# Patient Record
Sex: Male | Born: 1970 | Race: Black or African American | Hispanic: No | Marital: Single | Smoking: Never smoker
Health system: Southern US, Community
[De-identification: ages and names within clinical notes are randomized; demographics above are authoritative.]

## PROBLEM LIST (undated history)

## (undated) DIAGNOSIS — I10 Essential (primary) hypertension: Secondary | ICD-10-CM

## (undated) DIAGNOSIS — E119 Type 2 diabetes mellitus without complications: Secondary | ICD-10-CM

## (undated) HISTORY — PX: OTHER SURGICAL HISTORY: SHX169

---

## 2005-02-18 ENCOUNTER — Emergency Department: Payer: Self-pay | Admitting: Emergency Medicine

## 2006-07-04 IMAGING — CR DG ABDOMEN 3V
1 series · 5 of 5 positions shown · non-contrast
Comparison: none

REASON FOR EXAM: abd pain
COMMENTS:

[Series 1: view not recorded · 0.17mm/px · 5 of 5 slices shown]
[im 1/5]
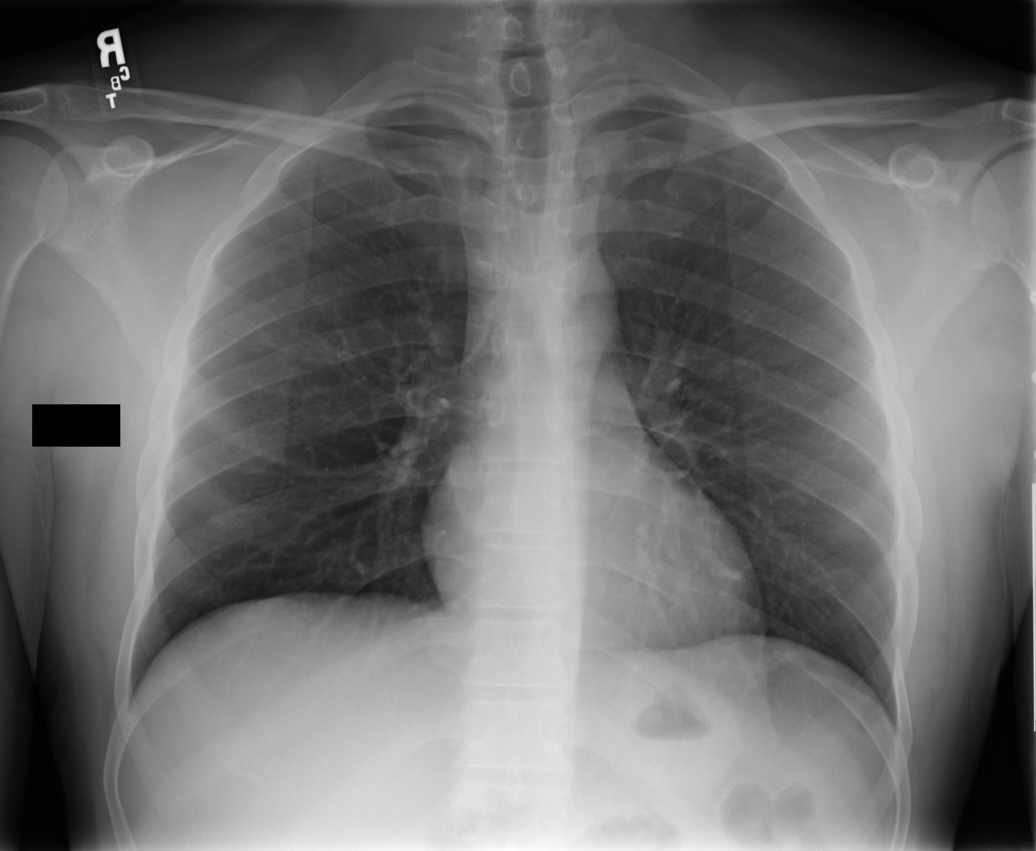
[im 2/5]
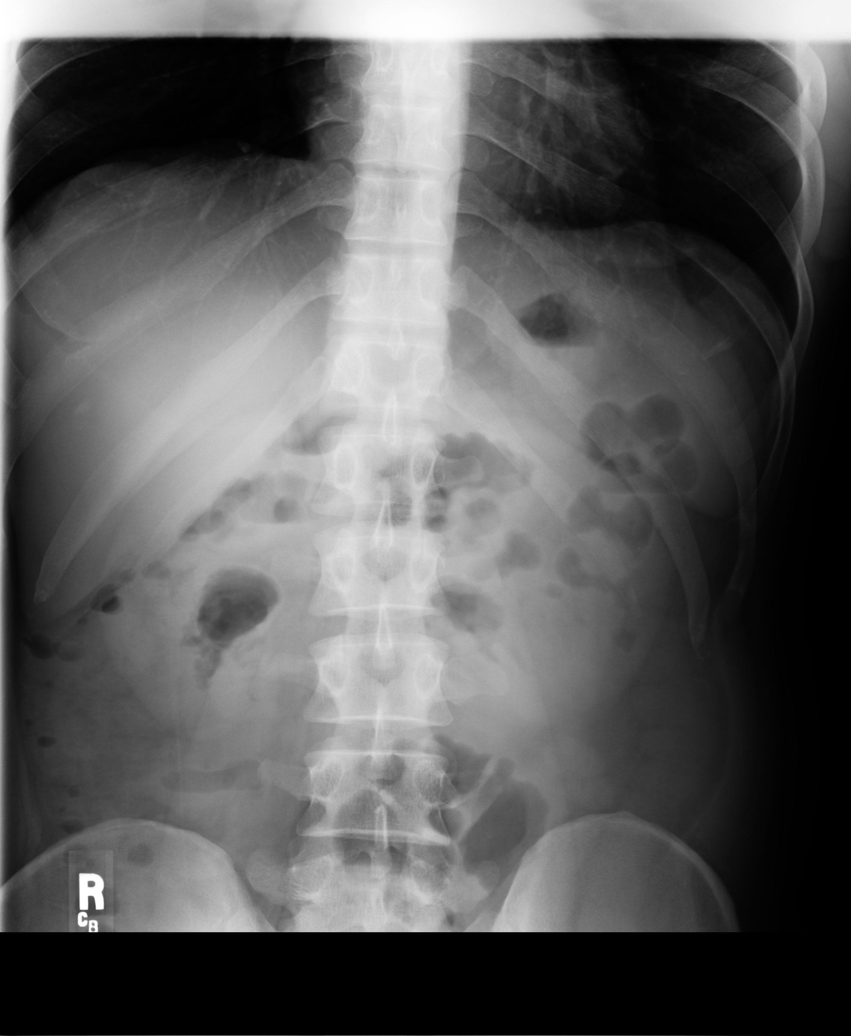
[im 3/5]
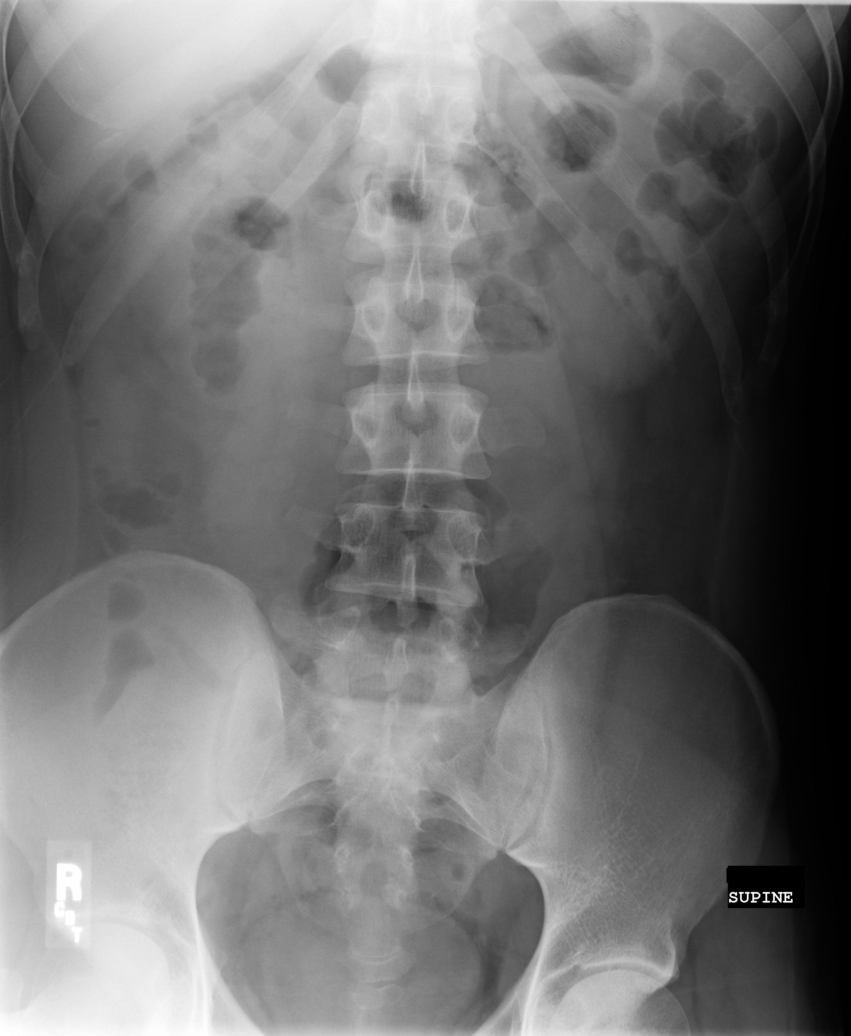
[im 4/5]
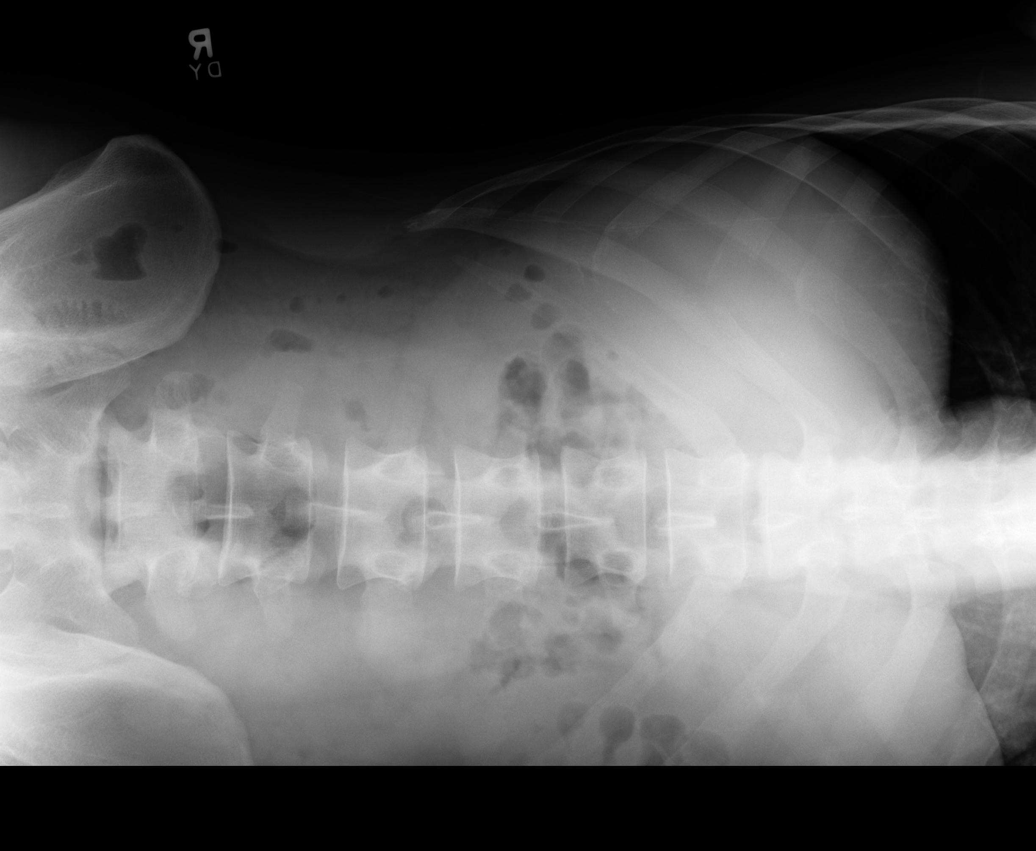
[im 5/5]
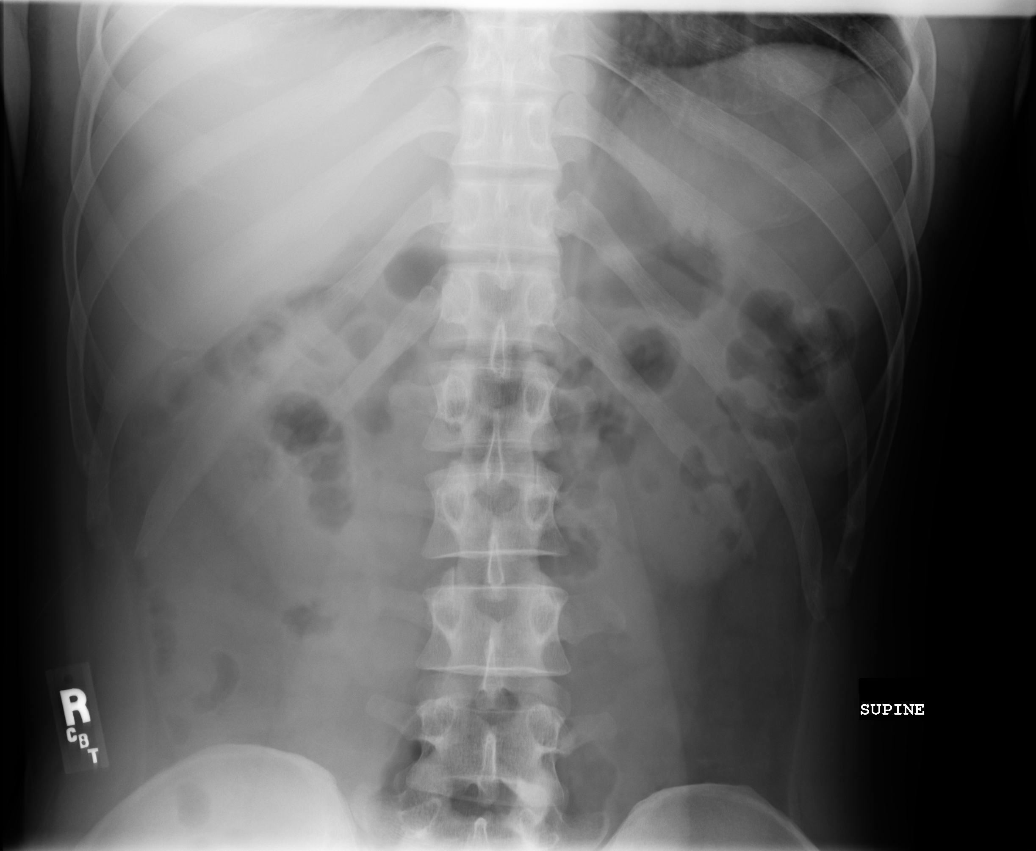

[5 of 5 positions shown; findings below may reference images not displayed]

PROCEDURE:     DXR - DXR ABDOMEN 3-WAY (INCL PA CXR)  - February 18, 2005  [DATE]

RESULT:     A single view of the chest shows the lungs are clear. The heart
and pulmonary vessels appear to be normal. The bony and mediastinal
structures are unremarkable.

The bowel gas pattern is unremarkable.  No free air or air fluid levels are
seen. Air and stool are scattered through the colon down to the rectum. No
abnormal calcifications are seen.
IMPRESSION: 2)No acute cardiopulmonary disease.

## 2009-01-11 ENCOUNTER — Emergency Department (HOSPITAL_COMMUNITY): Admission: EM | Admit: 2009-01-11 | Discharge: 2009-01-11 | Payer: Self-pay | Admitting: Emergency Medicine

## 2011-01-13 ENCOUNTER — Emergency Department: Payer: Self-pay | Admitting: Internal Medicine

## 2014-05-10 ENCOUNTER — Encounter (HOSPITAL_COMMUNITY): Payer: Self-pay | Admitting: Emergency Medicine

## 2014-05-10 ENCOUNTER — Emergency Department (HOSPITAL_COMMUNITY)
Admission: EM | Admit: 2014-05-10 | Discharge: 2014-05-10 | Disposition: A | Discharge status: Home / Self Care | Payer: Worker's Compensation | Attending: Emergency Medicine | Admitting: Emergency Medicine

## 2014-05-10 DIAGNOSIS — IMO0002 Reserved for concepts with insufficient information to code with codable children: Secondary | ICD-10-CM | POA: Insufficient documentation

## 2014-05-10 DIAGNOSIS — Y9289 Other specified places as the place of occurrence of the external cause: Secondary | ICD-10-CM | POA: Insufficient documentation

## 2014-05-10 DIAGNOSIS — Y9389 Activity, other specified: Secondary | ICD-10-CM | POA: Insufficient documentation

## 2014-05-10 DIAGNOSIS — Y990 Civilian activity done for income or pay: Secondary | ICD-10-CM | POA: Diagnosis not present

## 2014-05-10 DIAGNOSIS — X503XXA Overexertion from repetitive movements, initial encounter: Secondary | ICD-10-CM | POA: Insufficient documentation

## 2014-05-10 DIAGNOSIS — S46911A Strain of unspecified muscle, fascia and tendon at shoulder and upper arm level, right arm, initial encounter: Secondary | ICD-10-CM

## 2014-05-10 DIAGNOSIS — S4980XA Other specified injuries of shoulder and upper arm, unspecified arm, initial encounter: Secondary | ICD-10-CM | POA: Insufficient documentation

## 2014-05-10 DIAGNOSIS — S46909A Unspecified injury of unspecified muscle, fascia and tendon at shoulder and upper arm level, unspecified arm, initial encounter: Secondary | ICD-10-CM | POA: Insufficient documentation

## 2014-05-10 MED ORDER — HYDROCODONE-ACETAMINOPHEN 5-325 MG PO TABS: 1.0000 | ORAL_TABLET | ORAL | Status: DC | PRN

## 2014-05-10 MED ORDER — METHOCARBAMOL 500 MG PO TABS: 1000.0000 mg | ORAL_TABLET | Freq: Three times a day (TID) | ORAL | Status: DC

## 2014-05-10 MED ORDER — MELOXICAM 15 MG PO TABS: 15.0000 mg | ORAL_TABLET | Freq: Every day | ORAL | Status: DC

## 2014-05-10 MED ORDER — ACETAMINOPHEN 500 MG PO TABS
1000.0000 mg | ORAL_TABLET | Freq: Once | ORAL | Status: AC
  Administered 2014-05-10: 1000 mg via ORAL
  Filled 2014-05-10: qty 2

## 2014-05-10 MED ORDER — IBUPROFEN 800 MG PO TABS
800.0000 mg | ORAL_TABLET | Freq: Once | ORAL | Status: AC
  Administered 2014-05-10: 800 mg via ORAL
  Filled 2014-05-10: qty 1

## 2014-05-10 NOTE — Discharge Instructions (Signed)
Please use the shoulder immobilizer for the next 4 or 5 days. Please use Robaxin and Mobic daily. Use Norco for severe pain if needed. Robaxin and Norco may cause drowsiness, please use with caution. Please see Dr. Hilda Lias for additional evaluation if not improving.
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
## 2014-05-10 NOTE — ED Notes (Signed)
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

## 2014-05-10 NOTE — ED Provider Notes (Signed)
Medical screening examination/treatment/procedure(s) were performed by non-physician practitioner and as supervising physician I was immediately available for consultation/collaboration.   EKG Interpretation None        Keren Alverio L Judea Riches, MD 05/10/14 1456 

## 2017-10-26 ENCOUNTER — Other Ambulatory Visit: Payer: Self-pay

## 2017-10-26 ENCOUNTER — Encounter (HOSPITAL_COMMUNITY): Payer: Self-pay

## 2017-10-26 ENCOUNTER — Emergency Department (HOSPITAL_COMMUNITY): Payer: Self-pay

## 2017-10-26 ENCOUNTER — Emergency Department (HOSPITAL_COMMUNITY)
Admission: EM | Admit: 2017-10-26 | Discharge: 2017-10-26 | Disposition: A | Discharge status: Home / Self Care | Payer: Self-pay | Attending: Emergency Medicine | Admitting: Emergency Medicine

## 2017-10-26 DIAGNOSIS — Y999 Unspecified external cause status: Secondary | ICD-10-CM | POA: Insufficient documentation

## 2017-10-26 DIAGNOSIS — Y929 Unspecified place or not applicable: Secondary | ICD-10-CM | POA: Insufficient documentation

## 2017-10-26 DIAGNOSIS — S4351XA Sprain of right acromioclavicular joint, initial encounter: Secondary | ICD-10-CM | POA: Insufficient documentation

## 2017-10-26 DIAGNOSIS — W1839XA Other fall on same level, initial encounter: Secondary | ICD-10-CM | POA: Insufficient documentation

## 2017-10-26 DIAGNOSIS — Y939 Activity, unspecified: Secondary | ICD-10-CM | POA: Insufficient documentation

## 2017-10-26 MED ORDER — NAPROXEN 500 MG PO TABS: 500.0000 mg | ORAL_TABLET | Freq: Two times a day (BID) | ORAL | 0 refills | Status: DC

## 2017-10-26 MED ORDER — HYDROCODONE-ACETAMINOPHEN 5-325 MG PO TABS: 1.0000 | ORAL_TABLET | ORAL | 0 refills | Status: DC | PRN
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
## 2017-10-26 NOTE — ED Triage Notes (Signed)
Pt reports tripping and falling at home while moving furniture, landing on hardwood floor on his right shoulder.

## 2017-10-26 NOTE — ED Notes (Signed)
Patient twalked to X-ray

## 2017-10-27 MED FILL — Hydrocodone-Acetaminophen Tab 5-325 MG: ORAL | Qty: 6 | Status: AC

## 2017-11-24 ENCOUNTER — Encounter (HOSPITAL_COMMUNITY): Payer: Self-pay | Admitting: Emergency Medicine

## 2017-11-24 ENCOUNTER — Other Ambulatory Visit: Payer: Self-pay

## 2017-11-24 ENCOUNTER — Emergency Department (HOSPITAL_COMMUNITY)
Admission: EM | Admit: 2017-11-24 | Discharge: 2017-11-24 | Disposition: A | Discharge status: Home / Self Care | Payer: Self-pay | Attending: Emergency Medicine | Admitting: Emergency Medicine

## 2017-11-24 DIAGNOSIS — K029 Dental caries, unspecified: Secondary | ICD-10-CM | POA: Insufficient documentation

## 2017-11-24 DIAGNOSIS — R220 Localized swelling, mass and lump, head: Secondary | ICD-10-CM

## 2017-11-24 MED ORDER — PENICILLIN V POTASSIUM 500 MG PO TABS: 500.0000 mg | ORAL_TABLET | Freq: Four times a day (QID) | ORAL | 0 refills | Status: AC

## 2017-11-24 NOTE — Discharge Instructions (Signed)
Take Penicillin four times daily for one week Take Ibuprofen 600mg  2-3 times a day for inflammation Follow up with a dentist
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
## 2017-11-24 NOTE — ED Triage Notes (Signed)
Pt reports left sided oral swelling x2 days. Pt denies any known injury. Pt reports "could be cavity related." nad noted. Airway patent.

## 2019-10-01 ENCOUNTER — Other Ambulatory Visit: Payer: Medicaid Other

## 2020-02-25 ENCOUNTER — Emergency Department (HOSPITAL_COMMUNITY): Payer: Worker's Compensation

## 2020-02-25 ENCOUNTER — Other Ambulatory Visit: Payer: Self-pay

## 2020-02-25 ENCOUNTER — Encounter (HOSPITAL_COMMUNITY): Payer: Self-pay

## 2020-02-25 ENCOUNTER — Emergency Department (HOSPITAL_COMMUNITY)
Admission: EM | Admit: 2020-02-25 | Discharge: 2020-02-25 | Disposition: A | Discharge status: Home / Self Care | Payer: Worker's Compensation | Attending: Emergency Medicine | Admitting: Emergency Medicine

## 2020-02-25 DIAGNOSIS — W208XXA Other cause of strike by thrown, projected or falling object, initial encounter: Secondary | ICD-10-CM | POA: Insufficient documentation

## 2020-02-25 DIAGNOSIS — S60222A Contusion of left hand, initial encounter: Secondary | ICD-10-CM | POA: Insufficient documentation

## 2020-02-25 DIAGNOSIS — Y998 Other external cause status: Secondary | ICD-10-CM | POA: Insufficient documentation

## 2020-02-25 DIAGNOSIS — M79641 Pain in right hand: Secondary | ICD-10-CM | POA: Diagnosis not present

## 2020-02-25 DIAGNOSIS — Y9289 Other specified places as the place of occurrence of the external cause: Secondary | ICD-10-CM | POA: Diagnosis not present

## 2020-02-25 DIAGNOSIS — Y9389 Activity, other specified: Secondary | ICD-10-CM | POA: Insufficient documentation

## 2020-02-25 DIAGNOSIS — Z23 Encounter for immunization: Secondary | ICD-10-CM | POA: Insufficient documentation

## 2020-02-25 MED ORDER — TETANUS-DIPHTH-ACELL PERTUSSIS 5-2.5-18.5 LF-MCG/0.5 IM SUSP
0.5000 mL | Freq: Once | INTRAMUSCULAR | Status: AC
  Administered 2020-02-25: 0.5 mL via INTRAMUSCULAR
  Filled 2020-02-25: qty 0.5

## 2020-02-25 MED ORDER — HYDROCODONE-ACETAMINOPHEN 5-325 MG PO TABS: ORAL_TABLET | ORAL | 0 refills | Status: DC
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
## 2020-02-25 NOTE — ED Triage Notes (Signed)
Pt reports a steel rack fell on pt's hands.  Reports pain to left hand, abrasion noted to top of left hand.

## 2020-02-25 NOTE — Discharge Instructions (Signed)
Follow-up with Dr. Romeo Apple next week for recheck.  Also follow-up with your family doctor for your blood pressure because your blood pressure is elevated

## 2020-11-27 ENCOUNTER — Other Ambulatory Visit: Payer: Self-pay

## 2020-11-27 ENCOUNTER — Emergency Department (HOSPITAL_COMMUNITY)
Admission: EM | Admit: 2020-11-27 | Discharge: 2020-11-27 | Disposition: A | Discharge status: Home / Self Care | Payer: 59 | Attending: Emergency Medicine | Admitting: Emergency Medicine

## 2020-11-27 ENCOUNTER — Emergency Department (HOSPITAL_COMMUNITY): Payer: 59

## 2020-11-27 ENCOUNTER — Encounter (HOSPITAL_COMMUNITY): Payer: Self-pay | Admitting: *Deleted

## 2020-11-27 DIAGNOSIS — M25531 Pain in right wrist: Secondary | ICD-10-CM

## 2020-11-27 MED ORDER — TRAMADOL HCL 50 MG PO TABS: 50.0000 mg | ORAL_TABLET | Freq: Four times a day (QID) | ORAL | 0 refills | Status: DC | PRN

## 2020-11-27 NOTE — ED Notes (Signed)
Entered room and introduced self to patient. Pt appears to be resting in bed, respirations are even and unlabored with equal chest rise and fall. Bed is locked in the lowest position, side rails x1, call bell within reach. Pt educated on call light use and hourly rounding, verbalized understanding and in agreement at this time. All questions and concerns voiced addressed. Refreshments offered and provided per patient request.  

## 2020-11-27 NOTE — ED Notes (Signed)
ED Provider at bedside. 

## 2020-11-27 NOTE — ED Notes (Signed)
X-Ray at bedside.
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
## 2020-11-27 NOTE — ED Triage Notes (Signed)
Right wrist pain x 2 days, no known injury

## 2020-11-27 NOTE — Discharge Instructions (Addendum)
Your xray is negative for bony injury, dislocation or arthritis. 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  Wear the splint for comfort.  Continue taking your ibuprofen.  You may take the tramadol prescribed for additional pain relief if needed.  This will make you drowsy - do not drive within 4 hours of taking this medication.  Call Dr Romeo Apple for further evaluation if your symptoms persist or do not improve with this treatment plan.

## 2020-12-11 ENCOUNTER — Encounter: Payer: Self-pay | Admitting: Orthopedic Surgery

## 2020-12-11 ENCOUNTER — Other Ambulatory Visit: Payer: Self-pay

## 2020-12-11 ENCOUNTER — Ambulatory Visit (INDEPENDENT_AMBULATORY_CARE_PROVIDER_SITE_OTHER): Payer: 59 | Admitting: Orthopedic Surgery

## 2020-12-11 VITALS — BP 160/94 | HR 71 | Ht 74.0 in | Wt 215.0 lb

## 2020-12-11 DIAGNOSIS — M654 Radial styloid tenosynovitis [de Quervain]: Secondary | ICD-10-CM

## 2020-12-11 DIAGNOSIS — M65342 Trigger finger, left ring finger: Secondary | ICD-10-CM | POA: Diagnosis not present

## 2020-12-11 NOTE — Patient Instructions (Signed)
Trigger Finger  Trigger finger, also called stenosing tenosynovitis,  is a condition that causes a finger to get stuck in a bent position. Each finger has a tendon, which is a tough, cord-like tissue that connects muscle to bone, and each tendon passes through a tunnel of tissue called a tendon sheath. To move your finger, your tendon needs to glide freely through the sheath. Trigger finger happens when the tendon or the sheath thickens, making it difficult to move your finger. Trigger finger can affect any finger or a thumb. It may affect more than one finger. Mild cases may clear up with rest and medicine. Severe cases require more treatment. What are the causes? Trigger finger is caused by a thickened finger tendon or tendon sheath. The cause of this thickening is not known. What increases the risk? The following factors may make you more likely to develop this condition:  Doing activities that require a strong grip.  Having rheumatoid arthritis, gout, or diabetes.  Being 58-108 years old.  Being male. What are the signs or symptoms? Symptoms of this condition include:  Pain when bending or straightening your finger.  Tenderness or swelling where your finger attaches to the palm of your hand.  A lump in the palm of your hand or on the inside of your finger.  Hearing a noise like a pop or a snap when you try to straighten your finger.  Feeling a catching or locking sensation when you try to straighten your finger.  Being unable to straighten your finger. How is this diagnosed? This condition is diagnosed based on your symptoms and a physical exam. How is this treated? This condition may be treated by:  Resting your finger and avoiding activities that make symptoms worse.  Wearing a finger splint to keep your finger extended.  Taking NSAIDs, such as ibuprofen, to relieve pain and swelling.  Doing gentle exercises to stretch the finger as told by your health care  provider.  Having medicine that reduces swelling and inflammation (steroids) injected into the tendon sheath. Injections may need to be repeated.  Having surgery to open the tendon sheath. This may be done if other treatments do not work and you cannot straighten your finger. You may need physical therapy after surgery. Follow these instructions at home: If you have a splint:  Wear the splint as told by your health care provider. Remove it only as told by your health care provider.  Loosen it if your fingers tingle, become numb, or turn cold and blue.  Keep it clean.  If the splint is not waterproof: ? Do not let it get wet. ? Cover it with a watertight covering when you take a bath or shower. Managing pain, stiffness, and swelling If directed, apply heat to the affected area as often as told by your health care provider. Use the heat source that your health care provider recommends, such as a moist heat pack or a heating pad.  Place a towel between your skin and the heat source.  Leave the heat on for 20-30 minutes.  Remove the heat if your skin turns bright red. This is especially important if you are unable to feel pain, heat, or cold. You may have a greater risk of getting burned. If directed, put ice on the painful area. To do this:  If you have a removable splint, remove it as told by your health care provider.  Put ice in a plastic bag.  Place a towel between your skin  and the bag or between your splint and the bag.  Leave the ice on for 20 minutes, 2-3 times a day.      Activity  Rest your finger as told by your health care provider. Avoid activities that make the pain worse.  Return to your normal activities as told by your health care provider. Ask your health care provider what activities are safe for you.  Do exercises as told by your health care provider.  Ask your health care provider when it is safe to drive if you have a splint on your hand. General  instructions  Take over-the-counter and prescription medicines only as told by your health care provider.  Keep all follow-up visits as told by your health care provider. This is important. Contact a health care provider if:  Your symptoms are not improving with home care. Summary  Trigger finger, also called stenosing tenosynovitis, causes your finger to get stuck in a bent position. This can make it difficult and painful to straighten your finger.  This condition develops when a finger tendon or tendon sheath thickens.  Treatment may include resting your finger, wearing a splint, and taking medicines.  In severe cases, surgery to open the tendon sheath may be needed. This information is not intended to replace advice given to you by your health care provider. Make sure you discuss any questions you have with your health care provider. Document Revised: 01/11/2019 Document Reviewed: 01/11/2019 Elsevier Patient Education  2021 [employer]  Tommi Rumps Quervain's Tenosynovitis  De Quervain's tenosynovitis is a condition that causes inflammation of the tendon on the thumb side of the wrist. Tendons are cords of tissue that connect bones to muscles. The tendons in the hand pass through a tunnel called a sheath. A slippery layer of tissue (synovium) lets the tendons move smoothly in the sheath. With de Quervain's tenosynovitis, the sheath swells or thickens, causing friction and pain. The condition is also called de Quervain's disease and de Quervain's syndrome. It occurs most often in women who are 71-35 years old. What are the causes? The exact cause of this condition is not known. It may be associated with overuse of the hand and wrist. What increases the risk? You are more likely to develop this condition if you:  Use your hands far more than normal, especially if you repeat certain movements that involve twisting your hand or using a tight grip.  Are pregnant.  Are a middle-aged  woman.  Have rheumatoid arthritis.  Have diabetes. What are the signs or symptoms? The main symptom of this condition is pain on the thumb side of the wrist. The pain may get worse when you grasp something or turn your wrist. Other symptoms may include:  Pain that extends up the forearm.  Swelling of your wrist and hand.  Trouble moving the thumb and wrist.  A sensation of snapping in the wrist.  A bump filled with fluid (cyst) in the area of the pain. How is this diagnosed? This condition may be diagnosed based on:  Your symptoms and medical history.  A physical exam. During the exam, your health care provider may do a simple test Lourena Simmonds test) that involves pulling your thumb and wrist to see if this causes pain. You may also need to have an X-ray or ultrasound. How is this treated? Treatment for this condition may include:  Avoiding any activity that causes pain and swelling.  Taking medicines. Anti-inflammatory medicines and corticosteroid injections may be used to reduce  inflammation and relieve pain.  Wearing a splint.  Having surgery. This may be needed if other treatments do not work. Once the pain and swelling have gone down, you may start:  Physical therapy. This includes exercises to improve movement and strength in your wrist and thumb.  Occupational therapy. This includes adjusting how you move your wrist. Follow these instructions at home: If you have a splint:  Wear the splint as told by your health care provider. Remove it only as told by your health care provider.  Loosen the splint if your fingers tingle, become numb, or turn cold and blue.  Keep the splint clean.  If the splint is not waterproof: ? Do not let it get wet. ? Cover it with a watertight covering when you take a bath or a shower. Managing pain, stiffness, and swelling  Avoid movements and activities that cause pain and swelling in the wrist area.  If directed, put ice on the  painful area. This may be helpful after doing activities that involve the sore wrist. To do this: ? Put ice in a plastic bag. ? Place a towel between your skin and the bag. ? Leave the ice on for 20 minutes, 2-3 times a day. ? Remove the ice if your skin turns bright red. This is very important. If you cannot feel pain, heat, or cold, you have a greater risk of damage to the area.  Move your fingers often to reduce stiffness and swelling.  Raise (elevate) the injured area above the level of your heart while you are sitting or lying down.   General instructions  Return to your normal activities as told by your health care provider. Ask your health care provider what activities are safe for you.  Take over-the-counter and prescription medicines only as told by your health care provider.  Keep all follow-up visits. This is important. Contact a health care provider if:  Your pain medicine does not help.  Your pain gets worse.  You develop new symptoms. Summary  De Quervain's tenosynovitis is a condition that causes inflammation of the tendon on the thumb side of the wrist.  The condition occurs most often in women who are 87-11 years old.  The exact cause of this condition is not known. It may be associated with overuse of the hand and wrist.  Treatment starts with avoiding activity that causes pain or swelling in the wrist area. Other treatments may include wearing a splint and taking medicine. Sometimes, surgery is needed. This information is not intended to replace advice given to you by your health care provider. Make sure you discuss any questions you have with your health care provider. Document Revised: 12/08/2019 Document Reviewed: 12/08/2019 Elsevier Patient Education  2021 [employer].
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
## 2021-03-15 ENCOUNTER — Emergency Department (HOSPITAL_COMMUNITY)
Admission: EM | Admit: 2021-03-15 | Discharge: 2021-03-15 | Disposition: A | Discharge status: Home / Self Care | Payer: 59 | Attending: Emergency Medicine | Admitting: Emergency Medicine

## 2021-03-15 ENCOUNTER — Other Ambulatory Visit: Payer: Self-pay

## 2021-03-15 ENCOUNTER — Encounter (HOSPITAL_COMMUNITY): Payer: Self-pay | Admitting: Emergency Medicine

## 2021-03-15 DIAGNOSIS — L509 Urticaria, unspecified: Secondary | ICD-10-CM | POA: Insufficient documentation

## 2021-03-15 DIAGNOSIS — R21 Rash and other nonspecific skin eruption: Secondary | ICD-10-CM

## 2021-03-15 DIAGNOSIS — I10 Essential (primary) hypertension: Secondary | ICD-10-CM | POA: Insufficient documentation

## 2021-03-15 HISTORY — DX: Essential (primary) hypertension: I10

## 2021-03-15 MED ORDER — PREDNISONE 20 MG PO TABS: ORAL_TABLET | ORAL | 0 refills | Status: DC
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
## 2021-03-15 NOTE — ED Triage Notes (Signed)
Pt was on way to work this am when he noticed a rash to both arms. C/O itching. Stated he took one Benadryl at 4am.

## 2021-07-10 IMAGING — DX DG HAND COMPLETE 3+V*L*
3 series · 3 of 3 positions shown · non-contrast
Comparison: None.

CLINICAL DATA: Pt reports Magueritte Marcelin fell on pt's hands. Reports
pain to left hand, abrasion noted to top of left hand. Pain and
swelling to Rt hand with no abrasions.

EXAM:
LEFT HAND - COMPLETE 3+ VIEW; RIGHT HAND - COMPLETE 3+ VIEW

[hand ap]
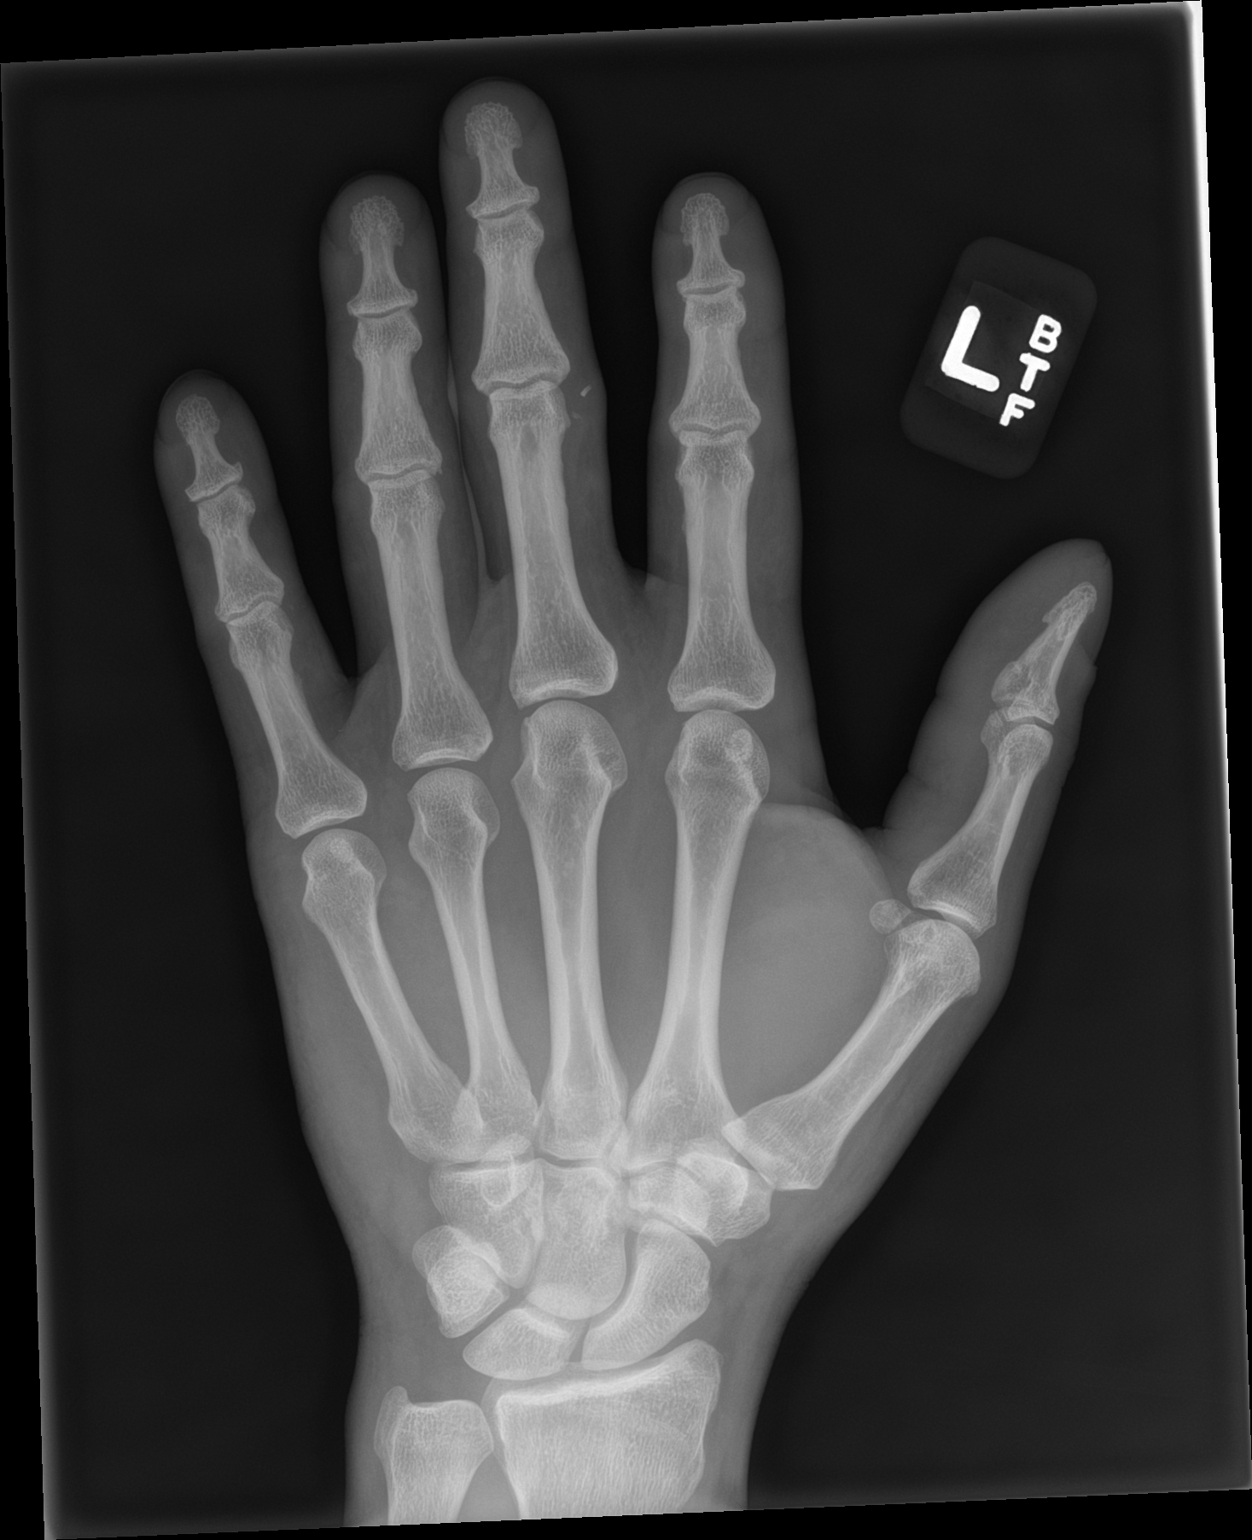

[hand obl]
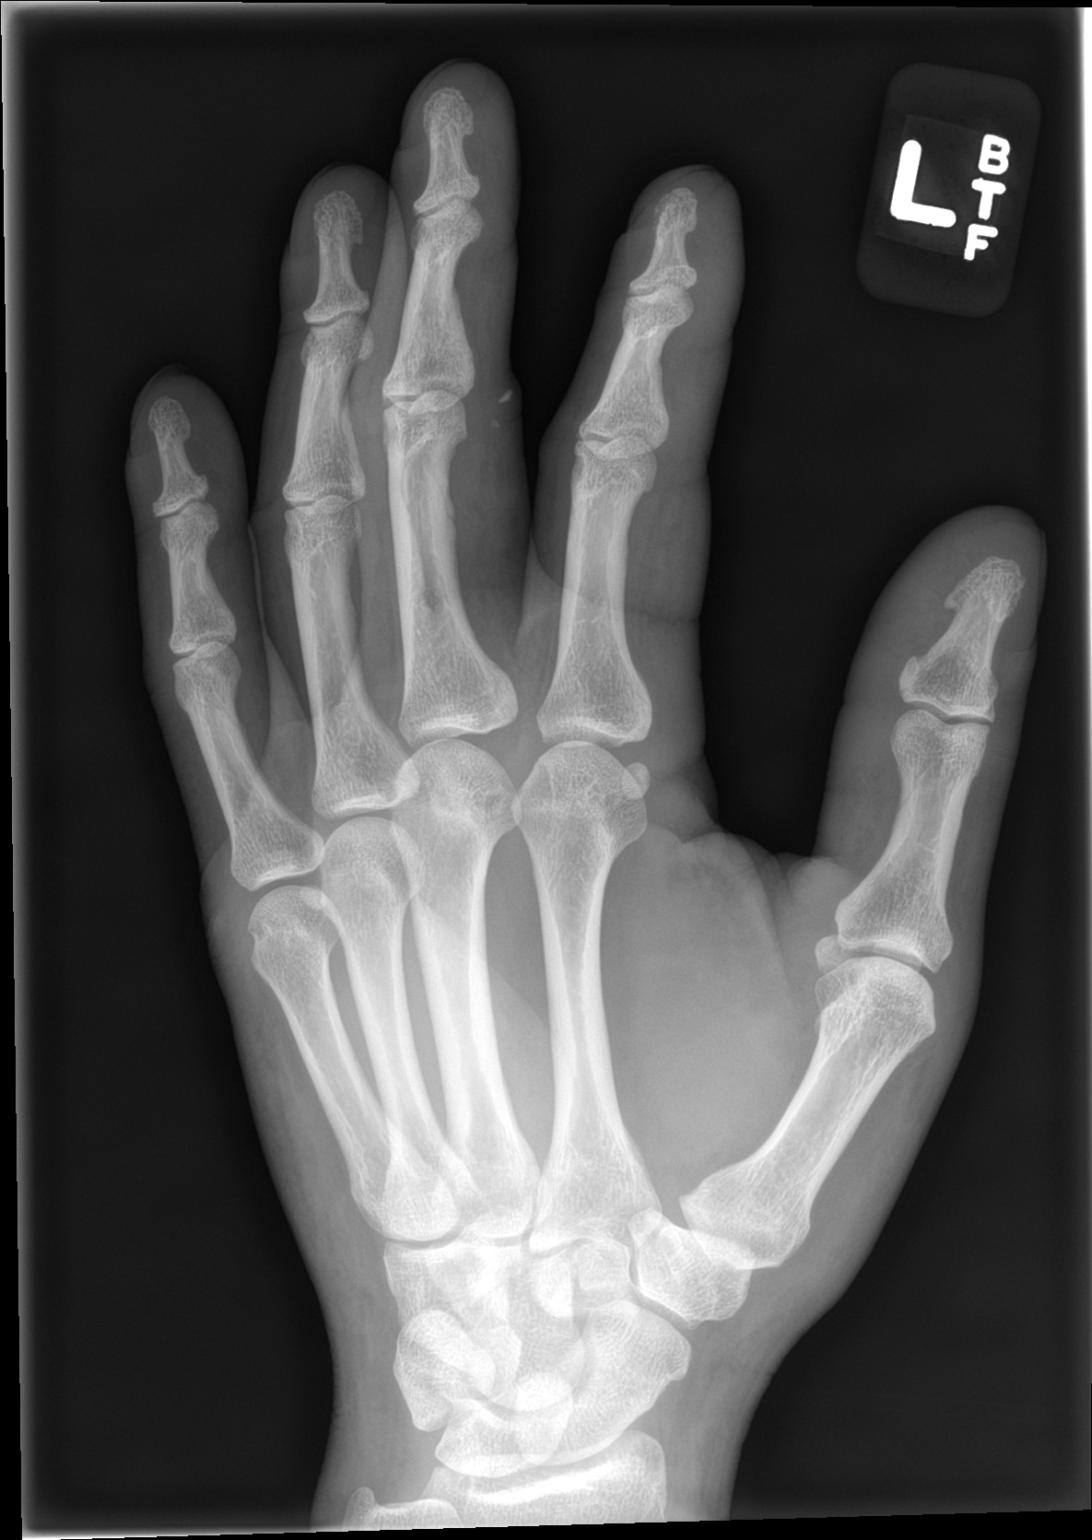

[hand lat]
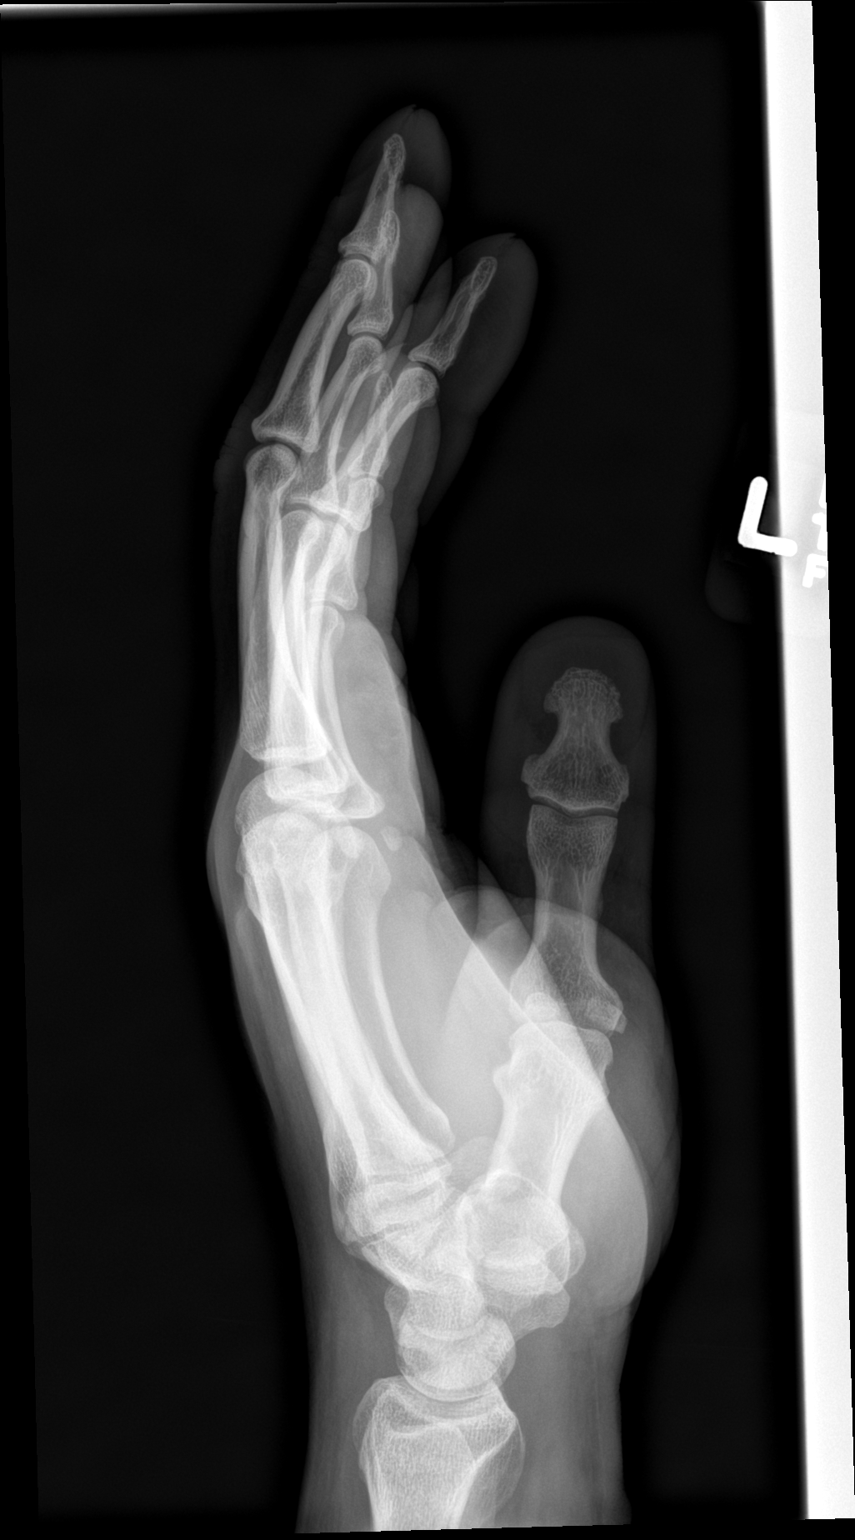

[3 of 3 positions shown; findings below may reference images not displayed]

FINDINGS: Right hand: There is no evidence of fracture or dislocation. There
is no evidence of arthropathy or other focal bone abnormality. There
are a few tiny radiopaque densities in the soft tissues adjacent to
the third digit PIP joint which could represent sequela of prior
trauma or foreign bodies.

Left hand: There is no evidence of fracture or dislocation. There is
no evidence of arthropathy or other focal bone abnormality. Regional
soft tissues are unremarkable.
IMPRESSION: 1. No acute osseous abnormality in the bilateral hands.
2. Tiny radiopaque densities in the soft tissues adjacent to the
right hand third digit PIP joint which could represent sequela of
prior trauma or foreign bodies.

## 2021-07-19 ENCOUNTER — Other Ambulatory Visit: Payer: Self-pay

## 2021-07-19 ENCOUNTER — Encounter (HOSPITAL_COMMUNITY): Payer: Self-pay

## 2021-07-19 ENCOUNTER — Emergency Department (HOSPITAL_COMMUNITY)
Admission: EM | Admit: 2021-07-19 | Discharge: 2021-07-19 | Disposition: A | Discharge status: Home / Self Care | Payer: Worker's Compensation | Attending: Emergency Medicine | Admitting: Emergency Medicine

## 2021-07-19 ENCOUNTER — Emergency Department (HOSPITAL_COMMUNITY): Payer: Worker's Compensation

## 2021-07-19 DIAGNOSIS — R739 Hyperglycemia, unspecified: Secondary | ICD-10-CM | POA: Insufficient documentation

## 2021-07-19 DIAGNOSIS — X501XXA Overexertion from prolonged static or awkward postures, initial encounter: Secondary | ICD-10-CM | POA: Insufficient documentation

## 2021-07-19 DIAGNOSIS — S29019A Strain of muscle and tendon of unspecified wall of thorax, initial encounter: Secondary | ICD-10-CM

## 2021-07-19 DIAGNOSIS — Y990 Civilian activity done for income or pay: Secondary | ICD-10-CM | POA: Insufficient documentation

## 2021-07-19 DIAGNOSIS — R0789 Other chest pain: Secondary | ICD-10-CM

## 2021-07-19 DIAGNOSIS — I10 Essential (primary) hypertension: Secondary | ICD-10-CM | POA: Insufficient documentation

## 2021-07-19 DIAGNOSIS — S29012A Strain of muscle and tendon of back wall of thorax, initial encounter: Secondary | ICD-10-CM | POA: Insufficient documentation

## 2021-07-19 LAB — CBC WITH DIFFERENTIAL/PLATELET
Abs Immature Granulocytes: 0.04 10*3/uL (ref 0.00–0.07)
Basophils Absolute: 0.1 10*3/uL (ref 0.0–0.1)
Basophils Relative: 1 %
Eosinophils Absolute: 0 10*3/uL (ref 0.0–0.5)
Eosinophils Relative: 1 %
HCT: 43.5 % (ref 39.0–52.0)
Hemoglobin: 15 g/dL (ref 13.0–17.0)
Immature Granulocytes: 1 %
Lymphocytes Relative: 43 %
Lymphs Abs: 2.7 10*3/uL (ref 0.7–4.0)
MCH: 31.1 pg (ref 26.0–34.0)
MCHC: 34.5 g/dL (ref 30.0–36.0)
MCV: 90.2 fL (ref 80.0–100.0)
Monocytes Absolute: 0.5 10*3/uL (ref 0.1–1.0)
Monocytes Relative: 8 %
Neutro Abs: 3 10*3/uL (ref 1.7–7.7)
Neutrophils Relative %: 46 %
Platelets: 242 10*3/uL (ref 150–400)
RBC: 4.82 MIL/uL (ref 4.22–5.81)
RDW: 12.4 % (ref 11.5–15.5)
WBC: 6.3 10*3/uL (ref 4.0–10.5)
nRBC: 0 % (ref 0.0–0.2)

## 2021-07-19 LAB — BASIC METABOLIC PANEL
Anion gap: 11 (ref 5–15)
BUN: 16 mg/dL (ref 6–20)
CO2: 25 mmol/L (ref 22–32)
Calcium: 9.7 mg/dL (ref 8.9–10.3)
Chloride: 98 mmol/L (ref 98–111)
Creatinine, Ser: 0.66 mg/dL (ref 0.61–1.24)
GFR, Estimated: >60 mL/min (ref >60)
Glucose, Bld: 221 mg/dL — ABNORMAL HIGH (ref 70–99)
Potassium: 3.9 mmol/L (ref 3.5–5.1)
Sodium: 134 mmol/L — ABNORMAL LOW (ref 135–145)

## 2021-07-19 LAB — TROPONIN I (HIGH SENSITIVITY)
Troponin I (High Sensitivity): 4 ng/L (ref <18)
Troponin I (High Sensitivity): 5 ng/L (ref <18)

## 2021-07-19 MED ORDER — NAPROXEN 500 MG PO TABS: 500.0000 mg | ORAL_TABLET | Freq: Two times a day (BID) | ORAL | 0 refills | Status: DC

## 2021-07-19 MED ORDER — CYCLOBENZAPRINE HCL 5 MG PO TABS: 5.0000 mg | ORAL_TABLET | Freq: Three times a day (TID) | ORAL | 0 refills | Status: DC | PRN

## 2021-07-19 NOTE — ED Triage Notes (Signed)
Pt states that he is also having some chest pain, states that it started a few weeks ago, states that he was walking around when the pain started.  States that when he breaths he can feel the pain/ pressure.
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
## 2021-07-19 NOTE — Discharge Instructions (Addendum)
Use the medicines prescribed for your back pain.  I also recommend alternating ice and heat, ice pack to your back can help with deep tissue inflammation, I suggest using ice especially if you have increased pain after a day at work.  You may also benefit from intermittent heat at the site, use a heating pad 20 minutes 2-3 times daily, heat will help relax and heal muscle injuries.  Plan to get rechecked by your primary doctor if your symptoms are not improving with this treatment plan.  Your blood glucose level is unusually elevated at 221 today.  Although this is not 100% fasting blood glucose level it is higher than it should be.  This will need to be rechecked by your primary doctor as a early morning fasting lab test.  Call Dr. Margo Aye for this test.  You are being referred to Dr. Wyline Mood of cardiology as discussed for further evaluation of these chest pain episodes.

## 2021-07-19 NOTE — ED Notes (Signed)
Patient transported to X-ray 

## 2021-07-19 NOTE — ED Triage Notes (Signed)
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  Pt states that he got hurt yesterday, but the pain was worse today.  Pt states that he has increased pain with movement.  Pt denies numbness or tingling in his legs.
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
## 2021-08-24 ENCOUNTER — Ambulatory Visit (INDEPENDENT_AMBULATORY_CARE_PROVIDER_SITE_OTHER): Payer: 59 | Admitting: Cardiology

## 2021-08-24 ENCOUNTER — Other Ambulatory Visit: Payer: Self-pay

## 2021-08-24 ENCOUNTER — Encounter: Payer: Self-pay | Admitting: Cardiology

## 2021-08-24 VITALS — BP 140/90 | HR 90 | Ht 74.0 in | Wt 216.0 lb

## 2021-08-24 DIAGNOSIS — R0789 Other chest pain: Secondary | ICD-10-CM | POA: Diagnosis not present

## 2021-08-24 DIAGNOSIS — R9431 Abnormal electrocardiogram [ECG] [EKG]: Secondary | ICD-10-CM | POA: Diagnosis not present

## 2021-08-24 DIAGNOSIS — R072 Precordial pain: Secondary | ICD-10-CM

## 2021-08-24 LAB — BASIC METABOLIC PANEL
BUN/Creatinine Ratio: 19 (ref 9–20)
BUN: 14 mg/dL (ref 6–24)
CO2: 21 mmol/L (ref 20–29)
Calcium: 10.2 mg/dL (ref 8.7–10.2)
Chloride: 95 mmol/L — ABNORMAL LOW (ref 96–106)
Creatinine, Ser: 0.74 mg/dL — ABNORMAL LOW (ref 0.76–1.27)
Glucose: 217 mg/dL — ABNORMAL HIGH (ref 70–99)
Potassium: 4.4 mmol/L (ref 3.5–5.2)
Sodium: 136 mmol/L (ref 134–144)
eGFR: 109 mL/min/{1.73_m2} (ref >59)

## 2021-08-24 MED ORDER — METOPROLOL TARTRATE 100 MG PO TABS: ORAL_TABLET | ORAL | 0 refills | Status: DC

## 2021-08-24 NOTE — Patient Instructions (Signed)
Medication Instructions:  Your Physician recommend you continue on your current medication as directed.    *If you need a refill on your cardiac medications before your next appointment, please call your pharmacy*   Lab Work: BMET today If you have labs (blood work) drawn today and your tests are completely normal, you will receive your results only by: MyChart Message (if you have MyChart) OR A paper copy in the mail If you have any lab test that is abnormal or we need to change your treatment, we will call you to review the results.   Testing/Procedures:   Your cardiac CT will be scheduled at one of the below locations:   [facility] [address] [place], [place] [address] [phone]  At [facility], please arrive at the [facility] main entrance (entrance A) of [facility] 30 minutes prior to test start time. You can use the FREE valet parking offered at the main entrance (encouraged to control the heart rate for the test) Proceed to the [facility] Radiology Department (first floor) to check-in and test prep.   Please follow these instructions carefully (unless otherwise directed):  Hold all erectile dysfunction medications at least 3 days (72 hrs) prior to test.  On the Night Before the Test: Be sure to Drink plenty of water. Do not consume any caffeinated/decaffeinated beverages or chocolate 12 hours prior to your test. Do not take any antihistamines 12 hours prior to your test. If the patient has contrast allergy: Patient will need a prescription for Prednisone and very clear instructions (as follows): Prednisone 50 mg - take 13 hours prior to test Take another Prednisone 50 mg 7 hours prior to test Take another Prednisone 50 mg 1 hour prior to test Take Benadryl 50 mg 1 hour prior to test Patient must complete all four doses of above prophylactic medications. Patient will need a ride after test due to Benadryl.  On the Day of the  Test: Drink plenty of water until 1 hour prior to the test. Do not eat any food 4 hours prior to the test. You may take your regular medications prior to the test.  Take metoprolol (Lopressor) 100 mg two hours prior to test. HOLD Furosemide/Hydrochlorothiazide morning of the test.      After the Test: Drink plenty of water. After receiving IV contrast, you may experience a mild flushed feeling. This is normal. On occasion, you may experience a mild rash up to 24 hours after the test. This is not dangerous. If this occurs, you can take Benadryl 25 mg and increase your fluid intake. If you experience trouble breathing, this can be serious. If it is severe call 911 IMMEDIATELY. If it is mild, please call our office. If you take any of these medications: Glipizide/Metformin, Avandament, Glucavance, please do not take 48 hours after completing test unless otherwise instructed.  Please allow 2-4 weeks for scheduling of routine cardiac CTs. Some insurance companies require a pre-authorization which may delay scheduling of this test.   For non-scheduling related questions, please contact the cardiac imaging nurse navigator should you have any questions/concerns: Rockwell Alexandria, Cardiac Imaging Nurse Navigator Larey Brick, Cardiac Imaging Nurse Navigator [facility] Direct Office Dial: [phone]   For scheduling needs, including cancellations and rescheduling, please call [place], [phone].   Follow-Up: At [facility], you and your health needs are our priority.  As part of our continuing mission to provide you with exceptional heart care, we  have created designated Provider Care Teams.  These Care Teams include your primary Cardiologist (physician) and Advanced Practice Providers (APPs -  Physician Assistants and Nurse Practitioners) who all work together to provide you with the care you need, when you need it.  We recommend signing up for the patient portal  called "MyChart".  Sign up information is provided on this After Visit Summary.  MyChart is used to connect with patients for Virtual Visits (Telemedicine).  Patients are able to view lab/test results, encounter notes, upcoming appointments, etc.  Non-urgent messages can be sent to your provider as well.   To learn more about what you can do with MyChart, go to [email or web address].    Your next appointment:    As needed.  The format for your next appointment:   In Person  Provider:   Rollene Rotunda, MD

## 2021-08-27 ENCOUNTER — Encounter: Payer: Self-pay | Admitting: *Deleted

## 2021-09-04 ENCOUNTER — Telehealth: Payer: Self-pay | Admitting: Cardiology

## 2021-09-04 NOTE — Telephone Encounter (Signed)
Patient is requesting a call back to discuss his lab results °

## 2021-09-04 NOTE — Telephone Encounter (Signed)
Pt updated and verbalized understanding.   Rollene Rotunda, MD  08/26/2021  6:52 PM EST     Labs OK.  No change in therapy.  Call Mr. Blankley with the results and send results to Benita Stabile, MD

## 2021-09-06 ENCOUNTER — Telehealth (HOSPITAL_COMMUNITY): Payer: Self-pay | Admitting: Emergency Medicine

## 2021-09-06 NOTE — Telephone Encounter (Signed)
Unable to leave vm Bryce Cheever RN Navigator Cardiac Imaging [facility] [phone] Office  [phone] Cell  

## 2021-09-07 ENCOUNTER — Ambulatory Visit (HOSPITAL_COMMUNITY): Admission: RE | Admit: 2021-09-07 | Payer: 59 | Source: Ambulatory Visit

## 2021-09-19 ENCOUNTER — Ambulatory Visit (HOSPITAL_COMMUNITY): Payer: 59

## 2021-09-19 ENCOUNTER — Telehealth (HOSPITAL_COMMUNITY): Payer: Self-pay | Admitting: Emergency Medicine

## 2021-09-19 NOTE — Telephone Encounter (Signed)
Calling to review CCTA instructions with patient however he wishes to cancel. States he will call back when ready to r/s Rockwell Alexandria RN Navigator Cardiac Imaging [facility] [phone] Office  [phone] Cell

## 2021-09-20 ENCOUNTER — Ambulatory Visit (HOSPITAL_COMMUNITY): Payer: 59

## 2021-09-28 ENCOUNTER — Encounter (HOSPITAL_COMMUNITY): Payer: Self-pay

## 2021-12-17 ENCOUNTER — Encounter (HOSPITAL_COMMUNITY): Payer: Self-pay | Admitting: Emergency Medicine

## 2021-12-17 ENCOUNTER — Emergency Department (HOSPITAL_COMMUNITY)
Admission: EM | Admit: 2021-12-17 | Discharge: 2021-12-17 | Disposition: A | Discharge status: Home / Self Care | Payer: 59 | Attending: Emergency Medicine | Admitting: Emergency Medicine

## 2021-12-17 ENCOUNTER — Other Ambulatory Visit: Payer: Self-pay

## 2021-12-17 DIAGNOSIS — Z79899 Other long term (current) drug therapy: Secondary | ICD-10-CM | POA: Diagnosis not present

## 2021-12-17 DIAGNOSIS — T7840XA Allergy, unspecified, initial encounter: Secondary | ICD-10-CM

## 2021-12-17 DIAGNOSIS — I10 Essential (primary) hypertension: Secondary | ICD-10-CM | POA: Diagnosis not present

## 2021-12-17 MED ORDER — DEXAMETHASONE 4 MG PO TABS
6.0000 mg | ORAL_TABLET | Freq: Once | ORAL | Status: AC
  Administered 2021-12-17: 6 mg via ORAL
  Filled 2021-12-17: qty 2

## 2021-12-17 MED ORDER — EPINEPHRINE 0.3 MG/0.3ML IJ SOAJ: 0.3000 mg | INTRAMUSCULAR | 0 refills | Status: AC | PRN

## 2021-12-17 NOTE — Discharge Instructions (Addendum)
You have been seen and discharged from the emergency department.  I believe he had an allergic reaction.  Please follow-up with your primary doctor for allergy testing.  You are given a dose of steroids here in the department.  Continue to take Benadryl every 6 hours for the next 2 days.  I have prescribed you an EpiPen, fill and use this as needed for a more severe reaction.  Follow-up with your primary provider for further evaluation and further care. Take home medications as prescribed. If you have any worsening symptoms or further concerns for your health please return to an emergency department for further evaluation. ?

## 2021-12-17 NOTE — ED Triage Notes (Signed)
Pt c/o what he states he believes is an allergic reaction. Itching on the sides his neck starting at 5 am this morning . States he does take Lisinopril for BP. Denies throat issues/SOB. No mouth swelling noted. Also states he took a benadryl and noticed relief  ?

## 2021-12-17 NOTE — ED Provider Notes (Signed)
?[facility] EMERGENCY DEPARTMENT ?Provider Note ? ? ?CSN: [id number] ?Arrival date & time: 12/17/21  4268 ? ?  ? ?History ? ?Chief Complaint  ?Patient presents with  ? Allergic Reaction  ? ? ?Jonmichael Duve is a 51 y.o. male. ? ?HPI ? ?51 year old male with past medical history of HTN presents to the emergency department with concern for allergic reaction.  Patient states that he got to work, noticed itchiness and redness to the bilateral sides of his cheeks.  He has had this happen 3 times before.  It is always resolved with Benadryl.  He is currently on lisinopril.  He never had any facial, lip, tongue/mouth swelling.  Patient states he took 1 dose of Benadryl prior to arrival and the symptoms resolved.  Never required an EpiPen in the past.  States he is otherwise been in his usual state of health with no new medications.  Unclear what the trigger of this event was. ? ?Home Medications ?Prior to Admission medications   ?Medication Sig Start Date End Date Taking? Authorizing Provider  ?atorvastatin (LIPITOR) 20 MG tablet Take 20 mg by mouth daily.    [provider]  ?cyclobenzaprine (FLEXERIL) 5 MG tablet Take 1 tablet (5 mg total) by mouth 3 (three) times daily as needed for muscle spasms. 07/19/21   Burgess Amor, PA-C  ?lisinopril (ZESTRIL) 10 MG tablet Take 10 mg by mouth daily.    [provider]  ?metoprolol tartrate (LOPRESSOR) 100 MG tablet Take two hours before test. 08/24/21   Rollene Rotunda, MD  ?naproxen (NAPROSYN) 500 MG tablet Take 1 tablet (500 mg total) by mouth 2 (two) times daily. 07/19/21   Burgess Amor, PA-C  ?predniSONE (DELTASONE) 20 MG tablet 3 tabs po daily x 3 days, then 2 tabs x 3 days, then 1.5 tabs x 3 days, then 1 tab x 3 days, then 0.5 tabs x 3 days 03/15/21   Mesner, Barbara Cower, MD  ?traMADol (ULTRAM) 50 MG tablet Take 50 mg by mouth every 6 (six) hours as needed.    [provider]  ?   ? ?Allergies    ?Patient has no known allergies.   ? ?Review of Systems    ?Review of Systems  ?Constitutional:  Negative for fever.  ?HENT:  Negative for drooling, facial swelling, trouble swallowing and voice change.   ?Respiratory:  Negative for shortness of breath and wheezing.   ?Cardiovascular:  Negative for chest pain and palpitations.  ?Gastrointestinal:  Negative for abdominal pain, nausea and vomiting.  ?Skin:  Positive for rash.  ?Neurological:  Negative for headaches.  ? ?Physical Exam ?Updated Vital Signs ?BP (!) 160/89   Pulse 78   Temp 97.9 ?F (36.6 ?C) (Oral)   Resp 18   Ht 6\' 2"  (1.88 m)   Wt 99.8 kg   SpO2 97%   BMI 28.25 kg/m?  ?Physical Exam ?Vitals and nursing note reviewed.  ?Constitutional:   ?   General: He is not in acute distress. ?   Appearance: Normal appearance. He is not diaphoretic.  ?HENT:  ?   Head: Normocephalic.  ?   Mouth/Throat:  ?   Mouth: Mucous membranes are moist.  ?   Pharynx: No posterior oropharyngeal erythema.  ?   Comments: No lip/intraoral swelling, uvula is midline and nonswollen, voice is baseline, swallowing without difficulty ?Cardiovascular:  ?   Rate and Rhythm: Normal rate.  ?Pulmonary:  ?   Effort: Pulmonary effort is normal. No respiratory distress.  ?   Breath  sounds: No wheezing.  ?Abdominal:  ?   Palpations: Abdomen is soft.  ?   Tenderness: There is no abdominal tenderness.  ?Musculoskeletal:     ?   General: No swelling.  ?Skin: ?   General: Skin is warm.  ?   Findings: No rash.  ?Neurological:  ?   Mental Status: He is alert and oriented to person, place, and time. Mental status is at baseline.  ?Psychiatric:     ?   Mood and Affect: Mood normal.  ? ? ?ED Results / Procedures / Treatments   ?Labs ?(all labs ordered are listed, but only abnormal results are displayed) ?Labs Reviewed - No data to display ? ?EKG ?None ? ?Radiology ?No results found. ? ?Procedures ?Procedures  ? ? ?Medications Ordered in ED ?Medications  ?dexamethasone (DECADRON) tablet 6 mg (has no administration in time range)  ? ? ?ED Course/ Medical  Decision Making/ A&P ?  ?                        ?Medical Decision Making ?Risk ?Prescription drug management. ? ? ?51 year old male presents emergency department with what sounds like mild allergic reaction.  He describes an urticarial itchy rash on his bilateral cheeks.  There is no other facial swelling, oral involvement or shortness of breath.  No symptoms of angioedema.  The patient is noted to be on lisinopril but again no findings of angioedema. Will not plan to DC medicine. ? ?Patient took 1 dose of Benadryl prior to arrival. On arrival face appears normal. No noted rash or signs of allergy. No facial swelling or edema. Lungs are clear, no signs of anaphylaxis.  ? ?Patient given dose of steroids here in the department, plan to continue Benadryl, I have sent home with a prescription for EpiPen and instructions to have allergy testing with his primary doctor.  Patient at this time appears safe and stable for discharge and close outpatient follow up. Discharge plan and strict return to ED precautions discussed, patient verbalizes understanding and agreement. ? ? ? ? ? ? ? ?Final Clinical Impression(s) / ED Diagnoses ?Final diagnoses:  ?None  ? ? ?Rx / DC Orders ?ED Discharge Orders   ? ? None  ? ?  ? ? ?  ?Rozelle Logan, DO ?12/17/21 0915 ? ?

## 2022-01-14 DIAGNOSIS — R7301 Impaired fasting glucose: Secondary | ICD-10-CM | POA: Diagnosis not present

## 2022-01-14 DIAGNOSIS — I10 Essential (primary) hypertension: Secondary | ICD-10-CM | POA: Diagnosis not present

## 2022-02-25 DIAGNOSIS — E1165 Type 2 diabetes mellitus with hyperglycemia: Secondary | ICD-10-CM | POA: Diagnosis not present

## 2022-06-13 DIAGNOSIS — E785 Hyperlipidemia, unspecified: Secondary | ICD-10-CM | POA: Diagnosis not present

## 2022-06-13 DIAGNOSIS — R7301 Impaired fasting glucose: Secondary | ICD-10-CM | POA: Diagnosis not present

## 2022-06-19 DIAGNOSIS — K219 Gastro-esophageal reflux disease without esophagitis: Secondary | ICD-10-CM | POA: Diagnosis not present

## 2022-06-19 DIAGNOSIS — R011 Cardiac murmur, unspecified: Secondary | ICD-10-CM | POA: Diagnosis not present

## 2022-06-19 DIAGNOSIS — E785 Hyperlipidemia, unspecified: Secondary | ICD-10-CM | POA: Diagnosis not present

## 2022-06-19 DIAGNOSIS — E118 Type 2 diabetes mellitus with unspecified complications: Secondary | ICD-10-CM | POA: Diagnosis not present

## 2022-06-19 DIAGNOSIS — L918 Other hypertrophic disorders of the skin: Secondary | ICD-10-CM | POA: Diagnosis not present

## 2022-06-19 DIAGNOSIS — R030 Elevated blood-pressure reading, without diagnosis of hypertension: Secondary | ICD-10-CM | POA: Diagnosis not present

## 2022-06-19 DIAGNOSIS — Z6828 Body mass index (BMI) 28.0-28.9, adult: Secondary | ICD-10-CM | POA: Diagnosis not present

## 2022-06-19 DIAGNOSIS — Z91148 Patient's other noncompliance with medication regimen for other reason: Secondary | ICD-10-CM | POA: Diagnosis not present

## 2022-07-26 ENCOUNTER — Encounter (HOSPITAL_COMMUNITY): Payer: Self-pay | Admitting: Emergency Medicine

## 2022-07-26 ENCOUNTER — Emergency Department (HOSPITAL_COMMUNITY): Payer: 59

## 2022-07-26 ENCOUNTER — Emergency Department (HOSPITAL_COMMUNITY)
Admission: EM | Admit: 2022-07-26 | Discharge: 2022-07-26 | Disposition: A | Discharge status: Home / Self Care | Payer: 59 | Attending: Emergency Medicine | Admitting: Emergency Medicine

## 2022-07-26 DIAGNOSIS — Z79899 Other long term (current) drug therapy: Secondary | ICD-10-CM | POA: Diagnosis not present

## 2022-07-26 DIAGNOSIS — R109 Unspecified abdominal pain: Secondary | ICD-10-CM | POA: Diagnosis not present

## 2022-07-26 DIAGNOSIS — R1031 Right lower quadrant pain: Secondary | ICD-10-CM | POA: Diagnosis not present

## 2022-07-26 HISTORY — DX: Type 2 diabetes mellitus without complications: E11.9

## 2022-07-26 LAB — CBC WITH DIFFERENTIAL/PLATELET
Abs Immature Granulocytes: 0.04 10*3/uL (ref 0.00–0.07)
Basophils Absolute: 0.1 10*3/uL (ref 0.0–0.1)
Basophils Relative: 1 %
Eosinophils Absolute: 0 10*3/uL (ref 0.0–0.5)
Eosinophils Relative: 0 %
HCT: 47 % (ref 39.0–52.0)
Hemoglobin: 16.1 g/dL (ref 13.0–17.0)
Immature Granulocytes: 1 %
Lymphocytes Relative: 44 %
Lymphs Abs: 2.5 10*3/uL (ref 0.7–4.0)
MCH: 30.1 pg (ref 26.0–34.0)
MCHC: 34.3 g/dL (ref 30.0–36.0)
MCV: 87.9 fL (ref 80.0–100.0)
Monocytes Absolute: 0.5 10*3/uL (ref 0.1–1.0)
Monocytes Relative: 9 %
Neutro Abs: 2.6 10*3/uL (ref 1.7–7.7)
Neutrophils Relative %: 45 %
Platelets: 277 10*3/uL (ref 150–400)
RBC: 5.35 MIL/uL (ref 4.22–5.81)
RDW: 12.9 % (ref 11.5–15.5)
WBC: 5.7 10*3/uL (ref 4.0–10.5)
nRBC: 0 % (ref 0.0–0.2)

## 2022-07-26 LAB — URINALYSIS, ROUTINE W REFLEX MICROSCOPIC
Bilirubin Urine: NEGATIVE
Glucose, UA: >=500 mg/dL — AB
Hgb urine dipstick: NEGATIVE
Ketones, ur: 5 mg/dL — AB
Leukocytes,Ua: NEGATIVE
Nitrite: NEGATIVE
Protein, ur: NEGATIVE mg/dL
Specific Gravity, Urine: 1.038 — ABNORMAL HIGH (ref 1.005–1.030)
pH: 5 (ref 5.0–8.0)

## 2022-07-26 LAB — COMPREHENSIVE METABOLIC PANEL
ALT: 18 U/L (ref 0–44)
AST: 15 U/L (ref 15–41)
Albumin: 4.4 g/dL (ref 3.5–5.0)
Alkaline Phosphatase: 60 U/L (ref 38–126)
Anion gap: 10 (ref 5–15)
BUN: 15 mg/dL (ref 6–20)
CO2: 23 mmol/L (ref 22–32)
Calcium: 10.1 mg/dL (ref 8.9–10.3)
Chloride: 100 mmol/L (ref 98–111)
Creatinine, Ser: 0.81 mg/dL (ref 0.61–1.24)
GFR, Estimated: >60 mL/min (ref >60)
Glucose, Bld: 213 mg/dL — ABNORMAL HIGH (ref 70–99)
Potassium: 4.3 mmol/L (ref 3.5–5.1)
Sodium: 133 mmol/L — ABNORMAL LOW (ref 135–145)
Total Bilirubin: 1 mg/dL (ref 0.3–1.2)
Total Protein: 8.4 g/dL — ABNORMAL HIGH (ref 6.5–8.1)

## 2022-07-26 LAB — LIPASE, BLOOD: Lipase: 47 U/L (ref 11–51)

## 2022-07-26 MED ORDER — IOHEXOL 300 MG/ML  SOLN
100.0000 mL | Freq: Once | INTRAMUSCULAR | Status: AC | PRN
  Administered 2022-07-26: 100 mL via INTRAVENOUS

## 2022-07-26 NOTE — ED Notes (Signed)
Pt has a Hx of diabetes

## 2022-07-26 NOTE — ED Provider Notes (Signed)
[facility] EMERGENCY DEPARTMENT Provider Note   CSN: [id number] Arrival date & time: 07/26/22  0546     History  Chief Complaint  Patient presents with   Flank Pain    Bobby Craig is a 51 y.o. male.  HPI 51 year old male presents with right lower abdominal pain since yesterday.  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  Otherwise the pain seems to be coming and going.  Is never been particularly severe.  No vomiting, diarrhea, constipation.  Took Pepto-Bismol to see if this would help but it did not.  No fevers or back pain.  No penile pain. Nothing he does makes the pain better or worse.  Home Medications Prior to Admission medications   Medication Sig Start Date End Date Taking? Authorizing Provider  atorvastatin (LIPITOR) 20 MG tablet Take 20 mg by mouth daily.    [provider]  cyclobenzaprine (FLEXERIL) 5 MG tablet Take 1 tablet (5 mg total) by mouth 3 (three) times daily as needed for muscle spasms. 07/19/21   Burgess Amor, PA-C  EPINEPHrine 0.3 mg/0.3 mL IJ SOAJ injection Inject 0.3 mg into the muscle as needed for anaphylaxis. 12/17/21   Horton, Clabe Seal, DO  lisinopril (ZESTRIL) 10 MG tablet Take 10 mg by mouth daily.    [provider]  metoprolol tartrate (LOPRESSOR) 100 MG tablet Take two hours before test. 08/24/21   Rollene Rotunda, MD  naproxen (NAPROSYN) 500 MG tablet Take 1 tablet (500 mg total) by mouth 2 (two) times daily. 07/19/21   Idol, Raynelle Fanning, PA-C  predniSONE (DELTASONE) 20 MG tablet 3 tabs po daily x 3 days, then 2 tabs x 3 days, then 1.5 tabs x 3 days, then 1 tab x 3 days, then 0.5 tabs x 3 days 03/15/21   Mesner, Barbara Cower, MD  traMADol (ULTRAM) 50 MG tablet Take 50 mg by mouth every 6 (six) hours as needed.    [provider]      Allergies    Patient has no known allergies.    Review of Systems   Review of Systems  Constitutional:  Negative for fever.   Respiratory:  Negative for shortness of breath.   Cardiovascular:  Negative for chest pain.  Gastrointestinal:  Positive for abdominal pain. Negative for vomiting.  Genitourinary:  Negative for dysuria.  Musculoskeletal:  Negative for back pain.    Physical Exam Updated Vital Signs BP (!) 121/92 (BP Location: Right Arm)   Pulse 74   Temp 97.8 F (36.6 C) (Oral)   Resp 18   Ht 6\' 2"  (1.88 m)   Wt 99.8 kg   SpO2 99%   BMI 28.25 kg/m  Physical Exam Vitals and nursing note reviewed.  Constitutional:      Appearance: He is well-developed.  HENT:     Head: Normocephalic and atraumatic.  Cardiovascular:     Rate and Rhythm: Normal rate and regular rhythm.     Heart sounds: Normal heart sounds.  Pulmonary:     Effort: Pulmonary effort is normal.     Breath sounds: Normal breath sounds.  Abdominal:     Palpations: Abdomen is soft.     Tenderness: There is abdominal tenderness (mild) in the right lower quadrant. There is no right CVA tenderness or left CVA tenderness.  Skin:    General: Skin is warm and dry.  Neurological:     Mental Status: He is alert.  ED Results / Procedures / Treatments   Labs (all labs ordered are listed, but only abnormal results are displayed) Labs Reviewed  URINALYSIS, ROUTINE W REFLEX MICROSCOPIC - Abnormal; Notable for the following components:      Result Value   Specific Gravity, Urine 1.038 (*)    Glucose, UA >=500 (*)    Ketones, ur 5 (*)    Bacteria, UA RARE (*)    All other components within normal limits  COMPREHENSIVE METABOLIC PANEL - Abnormal; Notable for the following components:   Sodium 133 (*)    Glucose, Bld 213 (*)    Total Protein 8.4 (*)    All other components within normal limits  CBC WITH DIFFERENTIAL/PLATELET  LIPASE, BLOOD    EKG None  Radiology CT ABDOMEN PELVIS W CONTRAST  Result Date: 07/26/2022 CLINICAL DATA:  Right lower quadrant abdominal pain flank pain starting yesterday, increasing with movement  EXAM: CT ABDOMEN AND PELVIS WITH CONTRAST TECHNIQUE: Multidetector CT imaging of the abdomen and pelvis was performed using the standard protocol following bolus administration of intravenous contrast. RADIATION DOSE REDUCTION: This exam was performed according to the departmental dose-optimization program which includes automated exposure control, adjustment of the mA and/or kV according to patient size and/or use of iterative reconstruction technique. CONTRAST:  OMNIPAQUE IOHEXOL 300 MG/ML  SOLN COMPARISON:  Report from 03/18/2004 FINDINGS: Lower chest: Mild bilateral gynecomastia. Hepatobiliary: Minimal transient hepatic attenuation difference peripherally in the left hepatic lobe on image 21 series 2 anteriorly, no underlying lesion observed. Gallbladder unremarkable. Pancreas: Unremarkable Spleen: Unremarkable Adrenals/Urinary Tract: Unremarkable Stomach/Bowel: Unremarkable.  Normal appendix. Vascular/Lymphatic: Unremarkable Reproductive: Mild prostatomegaly. Other: No supplemental non-categorized findings. Musculoskeletal: Mild bridging spurring of both sacroiliac joints anteriorly. IMPRESSION: 1. A specific cause for the patient's right lower quadrant abdominal pain is not identified. The appendix appears normal. 2. Mild bilateral gynecomastia. 3. Mild prostatomegaly. 4. Mild bridging spurring of both sacroiliac joints anteriorly. Electronically Signed   By: Gaylyn Rong M.D.   On: 07/26/2022 10:37    Procedures Procedures    Medications Ordered in ED Medications  iohexol (OMNIPAQUE) 300 MG/ML solution 100 mL (100 mLs Intravenous Contrast Given 07/26/22 0959)    ED Course/ Medical Decision Making/ A&P                           Medical Decision Making Amount and/or Complexity of Data Reviewed Labs: ordered.    Details: Hyperglycemia up to 213 but normal WBC, hemoglobin, lipase and no UTI Radiology: ordered and independent interpretation performed.    Details: No appendicitis or  obvious ureteral stone  Risk Prescription drug management.   Unclear was causing his pain.  Could be muscular as it does seem worse with certain movements such as being on the forklift at work.  However there is no obvious appendicitis or other emergent abdominal pathology.  I doubt infection.  At this point, we discussed return precautions and the potential for missed diagnoses but I think he is stable for discharge home with supportive care/expectant management.        Final Clinical Impression(s) / ED Diagnoses Final diagnoses:  Right lower quadrant abdominal pain    Rx / DC Orders ED Discharge Orders     None         Pricilla Loveless, MD 07/26/22 [phone]

## 2022-07-26 NOTE — Discharge Instructions (Addendum)
If you develop worsening, continued, or recurrent abdominal pain, uncontrolled vomiting, fever, chest or back pain, or any other new/concerning symptoms then return to the ER for evaluation.  

## 2022-07-26 NOTE — ED Triage Notes (Signed)
Pt c/o right sided flank pain since yesterday. Pt states pain increases with movement.

## 2022-10-04 DIAGNOSIS — E785 Hyperlipidemia, unspecified: Secondary | ICD-10-CM | POA: Diagnosis not present

## 2022-10-04 DIAGNOSIS — E118 Type 2 diabetes mellitus with unspecified complications: Secondary | ICD-10-CM | POA: Diagnosis not present

## 2022-10-08 DIAGNOSIS — I10 Essential (primary) hypertension: Secondary | ICD-10-CM | POA: Diagnosis not present

## 2022-10-08 DIAGNOSIS — Z7182 Exercise counseling: Secondary | ICD-10-CM | POA: Diagnosis not present

## 2022-10-08 DIAGNOSIS — E785 Hyperlipidemia, unspecified: Secondary | ICD-10-CM | POA: Diagnosis not present

## 2022-10-08 DIAGNOSIS — E1169 Type 2 diabetes mellitus with other specified complication: Secondary | ICD-10-CM | POA: Diagnosis not present

## 2022-10-08 DIAGNOSIS — Z713 Dietary counseling and surveillance: Secondary | ICD-10-CM | POA: Diagnosis not present

## 2022-10-08 DIAGNOSIS — R011 Cardiac murmur, unspecified: Secondary | ICD-10-CM | POA: Diagnosis not present

## 2022-10-08 DIAGNOSIS — E118 Type 2 diabetes mellitus with unspecified complications: Secondary | ICD-10-CM | POA: Diagnosis not present

## 2022-10-08 DIAGNOSIS — E663 Overweight: Secondary | ICD-10-CM | POA: Diagnosis not present

## 2022-10-08 DIAGNOSIS — Z91148 Patient's other noncompliance with medication regimen for other reason: Secondary | ICD-10-CM | POA: Diagnosis not present

## 2022-10-08 DIAGNOSIS — K219 Gastro-esophageal reflux disease without esophagitis: Secondary | ICD-10-CM | POA: Diagnosis not present

## 2022-10-08 DIAGNOSIS — R809 Proteinuria, unspecified: Secondary | ICD-10-CM | POA: Diagnosis not present

## 2022-10-29 DIAGNOSIS — E118 Type 2 diabetes mellitus with unspecified complications: Secondary | ICD-10-CM | POA: Diagnosis not present

## 2022-10-29 DIAGNOSIS — Z7182 Exercise counseling: Secondary | ICD-10-CM | POA: Diagnosis not present

## 2022-10-29 DIAGNOSIS — Z713 Dietary counseling and surveillance: Secondary | ICD-10-CM | POA: Diagnosis not present

## 2022-10-29 DIAGNOSIS — R69 Illness, unspecified: Secondary | ICD-10-CM | POA: Diagnosis not present

## 2022-10-29 DIAGNOSIS — E1159 Type 2 diabetes mellitus with other circulatory complications: Secondary | ICD-10-CM | POA: Diagnosis not present

## 2022-10-29 DIAGNOSIS — E663 Overweight: Secondary | ICD-10-CM | POA: Diagnosis not present

## 2022-10-29 DIAGNOSIS — I10 Essential (primary) hypertension: Secondary | ICD-10-CM | POA: Diagnosis not present

## 2022-10-29 DIAGNOSIS — Z7985 Long-term (current) use of injectable non-insulin antidiabetic drugs: Secondary | ICD-10-CM | POA: Diagnosis not present

## 2022-10-29 DIAGNOSIS — E1169 Type 2 diabetes mellitus with other specified complication: Secondary | ICD-10-CM | POA: Diagnosis not present

## 2022-10-29 DIAGNOSIS — E785 Hyperlipidemia, unspecified: Secondary | ICD-10-CM | POA: Diagnosis not present

## 2022-10-29 DIAGNOSIS — N539 Unspecified male sexual dysfunction: Secondary | ICD-10-CM | POA: Diagnosis not present

## 2023-01-13 DIAGNOSIS — E785 Hyperlipidemia, unspecified: Secondary | ICD-10-CM | POA: Diagnosis not present

## 2023-01-13 DIAGNOSIS — E118 Type 2 diabetes mellitus with unspecified complications: Secondary | ICD-10-CM | POA: Diagnosis not present

## 2023-01-13 DIAGNOSIS — Z7985 Long-term (current) use of injectable non-insulin antidiabetic drugs: Secondary | ICD-10-CM | POA: Diagnosis not present

## 2023-01-13 DIAGNOSIS — E1165 Type 2 diabetes mellitus with hyperglycemia: Secondary | ICD-10-CM | POA: Diagnosis not present

## 2023-01-13 DIAGNOSIS — Z125 Encounter for screening for malignant neoplasm of prostate: Secondary | ICD-10-CM | POA: Diagnosis not present

## 2023-01-13 DIAGNOSIS — R140 Abdominal distension (gaseous): Secondary | ICD-10-CM | POA: Diagnosis not present

## 2023-01-15 DIAGNOSIS — I10 Essential (primary) hypertension: Secondary | ICD-10-CM | POA: Diagnosis not present

## 2023-01-15 DIAGNOSIS — R140 Abdominal distension (gaseous): Secondary | ICD-10-CM | POA: Diagnosis not present

## 2023-01-15 DIAGNOSIS — R011 Cardiac murmur, unspecified: Secondary | ICD-10-CM | POA: Diagnosis not present

## 2023-01-15 DIAGNOSIS — N529 Male erectile dysfunction, unspecified: Secondary | ICD-10-CM | POA: Diagnosis not present

## 2023-01-15 DIAGNOSIS — E118 Type 2 diabetes mellitus with unspecified complications: Secondary | ICD-10-CM | POA: Diagnosis not present

## 2023-01-15 DIAGNOSIS — F5221 Male erectile disorder: Secondary | ICD-10-CM | POA: Diagnosis not present

## 2023-01-15 DIAGNOSIS — R809 Proteinuria, unspecified: Secondary | ICD-10-CM | POA: Diagnosis not present

## 2023-01-15 DIAGNOSIS — K219 Gastro-esophageal reflux disease without esophagitis: Secondary | ICD-10-CM | POA: Diagnosis not present

## 2023-01-15 DIAGNOSIS — Z91148 Patient's other noncompliance with medication regimen for other reason: Secondary | ICD-10-CM | POA: Diagnosis not present

## 2023-01-15 DIAGNOSIS — E663 Overweight: Secondary | ICD-10-CM | POA: Diagnosis not present

## 2023-01-15 DIAGNOSIS — E785 Hyperlipidemia, unspecified: Secondary | ICD-10-CM | POA: Diagnosis not present

## 2023-05-13 ENCOUNTER — Encounter (INDEPENDENT_AMBULATORY_CARE_PROVIDER_SITE_OTHER): Payer: Self-pay | Admitting: *Deleted

## 2023-07-08 ENCOUNTER — Telehealth (INDEPENDENT_AMBULATORY_CARE_PROVIDER_SITE_OTHER): Payer: Self-pay | Admitting: Gastroenterology

## 2023-07-08 NOTE — Telephone Encounter (Signed)
Ok to schedule.  Room 1/2  Thanks,  Vista Lawman, MD Gastroenterology and Hepatology [facility]

## 2023-07-08 NOTE — Telephone Encounter (Signed)
Who is your primary care physician: Dr.Zack Margo Aye  Reasons for the colonoscopy: Screening  Have you had a colonoscopy before?  no  Do you have family history of colon cancer? no  Previous colonoscopy with polyps removed? no  Do you have a history colorectal cancer?   no  Are you diabetic? If yes, Type 1 or Type 2?    Yes type 2  Do you have a prosthetic or mechanical heart valve? no  Do you have a pacemaker/defibrillator?   no  Have you had endocarditis/atrial fibrillation? no  Have you had joint replacement within the last 12 months?  no  Do you tend to be constipated or have to use laxatives? no  Do you have any history of drugs or alchohol?  no  Do you use supplemental oxygen?  no  Have you had a stroke or heart attack within the last 6 months? no  Do you take weight loss medication?  no  Do you take any blood-thinning medications such as: (aspirin, warfarin, Plavix, Aggrenox)    If yes we need the name, milligram, dosage and who is prescribing doctor  Current Outpatient Medications on File Prior to Visit  Medication Sig Dispense Refill   atorvastatin (LIPITOR) 20 MG tablet Take 20 mg by mouth daily.     cyclobenzaprine (FLEXERIL) 5 MG tablet Take 1 tablet (5 mg total) by mouth 3 (three) times daily as needed for muscle spasms. 15 tablet 0   EPINEPHrine 0.3 mg/0.3 mL IJ SOAJ injection Inject 0.3 mg into the muscle as needed for anaphylaxis. 1 each 0   lisinopril (ZESTRIL) 10 MG tablet Take 10 mg by mouth daily.     metoprolol tartrate (LOPRESSOR) 100 MG tablet Take two hours before test. 1 tablet 0   naproxen (NAPROSYN) 500 MG tablet Take 1 tablet (500 mg total) by mouth 2 (two) times daily. 20 tablet 0   predniSONE (DELTASONE) 20 MG tablet 3 tabs po daily x 3 days, then 2 tabs x 3 days, then 1.5 tabs x 3 days, then 1 tab x 3 days, then 0.5 tabs x 3 days 27 tablet 0   traMADol (ULTRAM) 50 MG tablet Take 50 mg by mouth every 6 (six) hours as needed.     No current  facility-administered medications on file prior to visit.    No Known Allergies   Pharmacy:   Primary Insurance Name: [employer] number where you can be reached: [phone]

## 2023-07-21 NOTE — Telephone Encounter (Signed)
Called pt, LMTCB to schedule

## 2023-09-04 ENCOUNTER — Encounter (INDEPENDENT_AMBULATORY_CARE_PROVIDER_SITE_OTHER): Payer: Self-pay | Admitting: *Deleted

## 2023-09-04 MED ORDER — PEG 3350-KCL-NA BICARB-NACL 420 G PO SOLR: 4000.0000 mL | Freq: Once | ORAL | 0 refills | Status: AC

## 2023-09-04 NOTE — Telephone Encounter (Signed)
Contacted pt insurance and benefits are active. [employer] does require prior auth. Prior auth to be done by going online or calling. Phone number [phone]/website [email or web address] Went online and form printed out and filled out will need to fax form along with questionnaire to [phone]

## 2023-09-04 NOTE — Telephone Encounter (Signed)
Pt contacted. Pt scheduled for 09/18/23. Prep sent to pharmacy. Will mail instructions once pre op is received. Will call insurance to check benefits due to avality saying member/subscribed id invalid

## 2023-09-04 NOTE — Telephone Encounter (Signed)
refer

## 2023-09-04 NOTE — Addendum Note (Signed)
Addended by: Marlowe Shores on: 09/04/2023 12:33 PM   Modules accepted: Orders

## 2023-09-08 NOTE — Telephone Encounter (Signed)
Fax from [employer] staing TCS has been approved. Valid through 09/04/23-11/23/23.

## 2023-09-15 ENCOUNTER — Encounter (HOSPITAL_COMMUNITY)
Admission: RE | Admit: 2023-09-15 | Discharge: 2023-09-15 | Disposition: A | Discharge status: Home / Self Care | Payer: 59 | Source: Ambulatory Visit | Attending: Gastroenterology | Admitting: Gastroenterology

## 2023-09-15 NOTE — Pre-Procedure Instructions (Signed)
 Attempted pre-op phone call. VM full and could not leave a message.

## 2023-09-17 ENCOUNTER — Other Ambulatory Visit: Payer: Self-pay

## 2023-09-17 ENCOUNTER — Encounter (HOSPITAL_COMMUNITY): Payer: Self-pay

## 2023-09-18 ENCOUNTER — Ambulatory Visit (HOSPITAL_BASED_OUTPATIENT_CLINIC_OR_DEPARTMENT_OTHER): Payer: 59 | Admitting: Anesthesiology

## 2023-09-18 ENCOUNTER — Encounter (HOSPITAL_COMMUNITY): Payer: Self-pay | Admitting: Gastroenterology

## 2023-09-18 ENCOUNTER — Ambulatory Visit (HOSPITAL_COMMUNITY): Payer: 59 | Admitting: Anesthesiology

## 2023-09-18 ENCOUNTER — Ambulatory Visit (HOSPITAL_COMMUNITY)
Admission: RE | Admit: 2023-09-18 | Discharge: 2023-09-18 | Disposition: A | Discharge status: Home / Self Care | Payer: 59 | Attending: Gastroenterology | Admitting: Gastroenterology

## 2023-09-18 ENCOUNTER — Encounter (HOSPITAL_COMMUNITY)
Admission: RE | Disposition: A | Discharge status: Home / Self Care | Payer: Self-pay | Source: Home / Self Care | Attending: Gastroenterology

## 2023-09-18 DIAGNOSIS — E119 Type 2 diabetes mellitus without complications: Secondary | ICD-10-CM | POA: Insufficient documentation

## 2023-09-18 DIAGNOSIS — D125 Benign neoplasm of sigmoid colon: Secondary | ICD-10-CM | POA: Diagnosis not present

## 2023-09-18 DIAGNOSIS — Z1211 Encounter for screening for malignant neoplasm of colon: Secondary | ICD-10-CM

## 2023-09-18 DIAGNOSIS — K5730 Diverticulosis of large intestine without perforation or abscess without bleeding: Secondary | ICD-10-CM

## 2023-09-18 DIAGNOSIS — D123 Benign neoplasm of transverse colon: Secondary | ICD-10-CM

## 2023-09-18 DIAGNOSIS — K635 Polyp of colon: Secondary | ICD-10-CM | POA: Diagnosis not present

## 2023-09-18 DIAGNOSIS — K649 Unspecified hemorrhoids: Secondary | ICD-10-CM

## 2023-09-18 DIAGNOSIS — K562 Volvulus: Secondary | ICD-10-CM

## 2023-09-18 DIAGNOSIS — I10 Essential (primary) hypertension: Secondary | ICD-10-CM | POA: Insufficient documentation

## 2023-09-18 HISTORY — PX: COLONOSCOPY WITH PROPOFOL: SHX5780

## 2023-09-18 HISTORY — PX: POLYPECTOMY: SHX5525

## 2023-09-18 LAB — GLUCOSE, CAPILLARY: Glucose-Capillary: 129 mg/dL — ABNORMAL HIGH (ref 70–99)

## 2023-09-18 LAB — HM COLONOSCOPY

## 2023-09-18 SURGERY — COLONOSCOPY WITH PROPOFOL
Anesthesia: General

## 2023-09-18 MED ORDER — LACTATED RINGERS IV SOLN: INTRAVENOUS | Status: DC

## 2023-09-18 MED ORDER — PROPOFOL 500 MG/50ML IV EMUL
INTRAVENOUS | Status: DC | PRN
  Administered 2023-09-18: 150 ug/kg/min via INTRAVENOUS
  Administered 2023-09-18: 100 ug/kg/min via INTRAVENOUS

## 2023-09-18 MED ORDER — PROPOFOL 10 MG/ML IV BOLUS
INTRAVENOUS | Status: DC | PRN
  Administered 2023-09-18: 200 mg via INTRAVENOUS

## 2023-09-18 NOTE — Transfer of Care (Signed)
 Immediate Anesthesia Transfer of Care Note  Patient: Bobby Craig  Procedure(s) Performed: COLONOSCOPY WITH PROPOFOL  POLYPECTOMY  Patient Location: Short Stay  Anesthesia Type:General  Level of Consciousness: drowsy and patient cooperative  Airway & Oxygen Therapy: Patient Spontanous Breathing  Post-op Assessment: Report given to RN and Post -op Vital signs reviewed and stable  Post vital signs: Reviewed and stable  Last Vitals:  Vitals Value Taken Time  BP 124/75 09/18/23 1126  Temp 36.4 C 09/18/23 1126  Pulse 69 09/18/23 1126  Resp 16 09/18/23 1126  SpO2 100 % 09/18/23 1126    Last Pain:  Vitals:   09/18/23 1126  TempSrc: Oral  PainSc: 0-No pain         Complications: No notable events documented.
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
## 2023-09-18 NOTE — Anesthesia Procedure Notes (Signed)
 Date/Time: 09/18/2023 10:35 AM  Performed by: Barbarann Verneita RAMAN, CRNAPre-anesthesia Checklist: Patient identified, Emergency Drugs available, Suction available, Timeout performed and Patient being monitored Patient Re-evaluated:Patient Re-evaluated prior to induction Oxygen Delivery Method: Nasal Cannula

## 2023-09-18 NOTE — ED Notes (Signed)
  September 18, 2023  Patient: Bobby Craig  Date of Birth: 1971-08-18  Date of Visit: 09/18/2023    To Whom It May Concern:  Bobby Craig was seen and treated in our short stay surgery center for a medical screening procedure on 09/18/2023. He required 24 hour supervision after the procedure. Bobby Craig  may return to work on 09/19/2023 .  Sincerely,   Clotilda DEL, RN

## 2023-09-18 NOTE — Discharge Instructions (Signed)

## 2023-09-18 NOTE — Op Note (Signed)
 [facility] Patient Name: Bobby Craig Procedure Date: 09/18/2023 10:29 AM MRN: [id number] Date of Birth: [birth year] Attending MD: Deatrice Dine , MD, [id number] CSN: [id number] Age: 53 Admit Type: Outpatient Procedure:                Colonoscopy Indications:              Screening for colorectal malignant neoplasm Providers:                Deatrice Dine, MD, Harlene Lips, Nidia Oak Referring MD:              Medicines:                Monitored Anesthesia Care Complications:            No immediate complications. Estimated Blood Loss:     Estimated blood loss was minimal. Procedure:                Pre-Anesthesia Assessment:                           - Prior to the procedure, a History and Physical                            was performed, and patient medications and                            allergies were reviewed. The patient's tolerance of                            previous anesthesia was also reviewed. The risks                            and benefits of the procedure and the sedation                            options and risks were discussed with the patient.                            All questions were answered, and informed consent                            was obtained. Prior Anticoagulants: The patient has                            taken no anticoagulant or antiplatelet agents. ASA                            Grade Assessment: III - A patient with severe                            systemic disease. After reviewing the risks and  benefits, the patient was deemed in satisfactory                            condition to undergo the procedure.                           After obtaining informed consent, the colonoscope                            was passed under direct vision. Throughout the                            procedure, the patient's blood pressure, pulse, and                             oxygen saturations were monitored continuously. The                            [id number]) scope was introduced through the                            anus and advanced to the the terminal ileum. The                            colonoscopy was technically difficult and complex                            due to significant looping. Successful completion                            of the procedure was aided by applying abdominal                            pressure. The patient tolerated the procedure well.                            The quality of the bowel preparation was evaluated                            using the BBPS [facility] Bowel Preparation Scale)                            with scores of: Right Colon = 2 (minor amount of                            residual staining, small fragments of stool and/or                            opaque liquid, but mucosa seen well), Transverse                            Colon = 2 (minor amount of residual staining, small  fragments of stool and/or opaque liquid, but mucosa                            seen well) and Left Colon = 2 (minor amount of                            residual staining, small fragments of stool and/or                            opaque liquid, but mucosa seen well). The total                            BBPS score equals 6. The terminal ileum, ileocecal                            valve, appendiceal orifice, and rectum were                            photographed. Scope In: 10:42:02 AM Scope Out: 11:20:28 AM Scope Withdrawal Time: 0 hours 29 minutes 58 seconds  Total Procedure Duration: 0 hours 38 minutes 26 seconds  Findings:      Four sessile polyps were found in the sigmoid colon, splenic flexure and       transverse colon. The polyps were 4 to 8 mm in size. These polyps were       removed with a cold snare. Resection and retrieval were complete.      A 10 mm polyp was found in the transverse colon.  The polyp was sessile.       The polyp was removed with a cold snare. Resection and retrieval were       complete. Impression:               - Four 4 to 8 mm polyps in the sigmoid colon, at                            the splenic flexure and in the transverse colon,                            removed with a cold snare. Resected and retrieved.                           - One 10 mm polyp in the transverse colon, removed                            with a cold snare. Resected and retrieved. Moderate Sedation:      Per Anesthesia Care Recommendation:           - Patient has a contact number available for                            emergencies. The signs and symptoms of potential                            delayed complications were discussed with the  patient. Return to normal activities tomorrow.                            Written discharge instructions were provided to the                            patient.                           - Resume previous diet.                           - Continue present medications.                           - Await pathology results.                           - Repeat colonoscopy in 3 years for surveillance.                           - Return to primary care physician as previously                            scheduled.                           -First degree relatives to start colonoscopy at age                            25 , given 1cm polyp found if this returns as                            adenoma Procedure Code(s):        --- Professional ---                           [phone], Colonoscopy, flexible; with removal of                            tumor(s), polyp(s), or other lesion(s) by snare                            technique Diagnosis Code(s):        --- Professional ---                           Z12.11, Encounter for screening for malignant                            neoplasm of colon                           D12.5, Benign  neoplasm of sigmoid colon                           D12.3, Benign neoplasm of transverse colon (hepatic  flexure or splenic flexure) CPT copyright 2022 American Medical Association. All rights reserved. The codes documented in this report are preliminary and upon coder review may  be revised to meet current compliance requirements. Deatrice Dine, MD Deatrice Dine, MD 09/18/2023 11:30:55 AM This report has been signed electronically. Number of Addenda: 0

## 2023-09-18 NOTE — Anesthesia Preprocedure Evaluation (Signed)
 Anesthesia Evaluation  Patient identified by MRN, date of birth, ID band Patient awake    Reviewed: Allergy & Precautions, H&P , NPO status , Patient's Chart, lab work & pertinent test results, reviewed documented beta blocker date and time   Airway Mallampati: II  TM Distance: >3 FB Neck ROM: full    Dental no notable dental hx.    Pulmonary neg pulmonary ROS   Pulmonary exam normal breath sounds clear to auscultation       Cardiovascular Exercise Tolerance: Good hypertension, negative cardio ROS  Rhythm:regular Rate:Normal     Neuro/Psych negative neurological ROS  negative psych ROS   GI/Hepatic negative GI ROS, Neg liver ROS,,,  Endo/Other  negative endocrine ROSdiabetes    Renal/GU negative Renal ROS  negative genitourinary   Musculoskeletal   Abdominal   Peds  Hematology negative hematology ROS (+)   Anesthesia Other Findings   Reproductive/Obstetrics negative OB ROS                             Anesthesia Physical Anesthesia Plan  ASA: 2  Anesthesia Plan: General   Post-op Pain Management:    Induction:   PONV Risk Score and Plan: Propofol infusion  Airway Management Planned:   Additional Equipment:   Intra-op Plan:   Post-operative Plan:   Informed Consent: I have reviewed the patients History and Physical, chart, labs and discussed the procedure including the risks, benefits and alternatives for the proposed anesthesia with the patient or authorized representative who has indicated his/her understanding and acceptance.     Dental Advisory Given  Plan Discussed with: CRNA  Anesthesia Plan Comments:        Anesthesia Quick Evaluation

## 2023-09-19 ENCOUNTER — Encounter (INDEPENDENT_AMBULATORY_CARE_PROVIDER_SITE_OTHER): Payer: Self-pay | Admitting: *Deleted

## 2023-09-19 ENCOUNTER — Encounter (HOSPITAL_COMMUNITY): Payer: Self-pay | Admitting: Gastroenterology

## 2023-09-19 LAB — SURGICAL PATHOLOGY

## 2023-09-19 NOTE — Anesthesia Postprocedure Evaluation (Signed)
 Anesthesia Post Note  Patient: Bobby Craig  Procedure(s) Performed: COLONOSCOPY WITH PROPOFOL  POLYPECTOMY  Patient location during evaluation: Phase II Anesthesia Type: General Level of consciousness: awake Pain management: pain level controlled Vital Signs Assessment: post-procedure vital signs reviewed and stable Respiratory status: spontaneous breathing and respiratory function stable Cardiovascular status: blood pressure returned to baseline and stable Postop Assessment: no headache and no apparent nausea or vomiting Anesthetic complications: no Comments: Late entry   No notable events documented.   Last Vitals:  Vitals:   09/18/23 0900 09/18/23 1126  BP: (!) 153/85 124/75  Pulse:  69  Resp:  16  Temp:  36.4 C  SpO2:  100%    Last Pain:  Vitals:   09/19/23 1000  TempSrc:   PainSc: 0-No pain                 [name]

## 2023-09-29 NOTE — Progress Notes (Signed)
I reviewed the pathology results. Ann, can you send her a letter with the findings as described below please? Repeat colonoscopy in 3 years  Thanks,  Vista Lawman, MD Gastroenterology and Hepatology [facility]  ---------------------------------------------------------------------------------------------  [facility] [address], [address], [place], [place] [address] Phone:  [phone]   09/29/23 [place], [place]   Dear Waldon Merl,  I am writing to inform you that the biopsies taken during your recent endoscopic examination showed:  I am writing to let you know the results of your recent colonoscopy.  You had a total of 5 polyps removed. The pathology came back as "tubular adenoma." These findings are NOT cancer, but had the polyps remained in your colon, they could have turned into cancer.  Given these findings, it is recommended that your next colonoscopy be performed in 3 years as one of the polyp was considered large in size .   Also I value your feedback , so if you get a survey , please take the time to fill it out and thank you for choosing [facility]  Please call us at [phone] if you have persistent problems or have questions about your condition that have not been fully answered at this time.  Sincerely,  Vista Lawman, MD Gastroenterology and Hepatology

## 2023-10-02 ENCOUNTER — Encounter (INDEPENDENT_AMBULATORY_CARE_PROVIDER_SITE_OTHER): Payer: Self-pay | Admitting: *Deleted

## 2023-12-02 ENCOUNTER — Encounter: Payer: Self-pay | Admitting: Emergency Medicine

## 2023-12-02 ENCOUNTER — Ambulatory Visit
Admission: EM | Admit: 2023-12-02 | Discharge: 2023-12-02 | Disposition: A | Discharge status: Home / Self Care | Payer: Self-pay | Attending: Nurse Practitioner | Admitting: Nurse Practitioner

## 2023-12-02 DIAGNOSIS — S29019A Strain of muscle and tendon of unspecified wall of thorax, initial encounter: Secondary | ICD-10-CM

## 2023-12-02 DIAGNOSIS — Z8739 Personal history of other diseases of the musculoskeletal system and connective tissue: Secondary | ICD-10-CM

## 2023-12-02 LAB — POCT FASTING CBG KUC MANUAL ENTRY: POCT Glucose (KUC): 232 mg/dL — AB (ref 70–99)

## 2023-12-02 MED ORDER — KETOROLAC TROMETHAMINE 30 MG/ML IJ SOLN
30.0000 mg | Freq: Once | INTRAMUSCULAR | Status: AC
  Administered 2023-12-02: 30 mg via INTRAMUSCULAR

## 2023-12-02 MED ORDER — METHOCARBAMOL 500 MG PO TABS: 500.0000 mg | ORAL_TABLET | Freq: Two times a day (BID) | ORAL | 0 refills | Status: AC

## 2023-12-02 NOTE — Discharge Instructions (Addendum)
 You were given an injection of Toradol 30 mg today.  Do not take any additional NSAIDs such as ibuprofen, Aleve, Advil, Motrin, or naproxen today.  You may take over-the-counter Tylenol for breakthrough pain or discomfort. Take medication as prescribed.  The medication may cause drowsiness.  Do not drink alcohol, operate heavy equipment, or drive while taking the medication. Recommend the use of ice or heat to the affected area.  Apply ice for pain or swelling, heat for spasm or stiffness.  Apply for 20 minutes, remove for 1 hour, repeat as needed. Gentle stretching exercises and range of motion exercises are recommended to help decrease your recovery time.  I provided exercises for you to perform at least 2-3 times daily while symptoms persist. Go to the emergency department immediately if you experience sudden loss of bowel or bladder, numbness or tingling in your legs hands or feet, or other concerns. As discussed, if symptoms do not improve with this treatment, recommend following up with orthopedics for further evaluation. Follow-up as needed.
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
## 2023-12-02 NOTE — ED Triage Notes (Signed)
 Mid back pain x 1 week.  States hurts to turn

## 2024-07-15 ENCOUNTER — Other Ambulatory Visit: Payer: Self-pay

## 2024-07-15 ENCOUNTER — Emergency Department (HOSPITAL_COMMUNITY)
Admission: EM | Admit: 2024-07-15 | Discharge: 2024-07-15 | Disposition: A | Discharge status: Home / Self Care | Payer: Self-pay | Attending: Emergency Medicine | Admitting: Emergency Medicine

## 2024-07-15 DIAGNOSIS — K047 Periapical abscess without sinus: Secondary | ICD-10-CM | POA: Insufficient documentation

## 2024-07-15 MED ORDER — AMOXICILLIN 500 MG PO CAPS: 500.0000 mg | ORAL_CAPSULE | Freq: Three times a day (TID) | ORAL | 0 refills | Status: AC

## 2024-07-15 MED ORDER — HYDROCODONE-ACETAMINOPHEN 5-325 MG PO TABS: 1.0000 | ORAL_TABLET | Freq: Four times a day (QID) | ORAL | 0 refills | Status: AC | PRN

## 2024-07-15 MED ORDER — HYDROCODONE-ACETAMINOPHEN 5-325 MG PO TABS
1.0000 | ORAL_TABLET | Freq: Once | ORAL | Status: AC
  Administered 2024-07-15: 1 via ORAL
  Filled 2024-07-15: qty 1

## 2024-07-15 NOTE — ED Provider Notes (Signed)
 [place] [facility] Provider Note   CSN: [id number] Arrival date & time: 07/15/24  1134     Patient presents with: Oral Swelling   Bobby Craig is a 53 y.o. male.   Patient is a 53 year old male who presents emergency department the chief complaint of inferior gumline pain which has been ongoing for about the past 3 weeks but has been gradually becoming worse.  He notes that he has been taking 800 mg ibuprofen  with some improvement in his symptoms.  He notes he has had no associated stridor, dysphagia, drooling, dyspnea.  He has had no associated fever or chills.  He denies any chest pain, shortness of breath.        Prior to Admission medications   Medication Sig Start Date End Date Taking? Authorizing Provider  atorvastatin (LIPITOR) 20 MG tablet Take 20 mg by mouth daily.    [provider]  EPINEPHrine  0.3 mg/0.3 mL IJ SOAJ injection Inject 0.3 mg into the muscle as needed for anaphylaxis. 12/17/21   Horton, Kristie M, DO  lisinopril (ZESTRIL) 10 MG tablet Take 40 mg by mouth daily.    [provider]  methocarbamol  (ROBAXIN ) 500 MG tablet Take 1 tablet (500 mg total) by mouth 2 (two) times daily. 12/02/23   Leath-Warren, Etta PARAS, NP    Allergies: Patient has no known allergies.    Review of Systems  HENT:  Positive for dental problem.     Updated Vital Signs BP (!) 151/76   Pulse 86   Temp 98.3 F (36.8 C)   Resp 18   Ht 6' 2 (1.88 m)   Wt 97.5 kg   SpO2 99%   BMI 27.60 kg/m   Physical Exam Vitals and nursing note reviewed.  Constitutional:      General: He is not in acute distress.    Appearance: Normal appearance. He is not ill-appearing.  HENT:     Head: Normocephalic and atraumatic.     Nose: Nose normal.     Mouth/Throat:     Mouth: Mucous membranes are moist.     Comments: Swelling noted along the inferior gumline along the central and lateral incisors, floor mouth is soft, tolerant  secretions without difficulty, no peritonsillar swelling, no areas of induration or fluctuance Eyes:     Extraocular Movements: Extraocular movements intact.     Conjunctiva/sclera: Conjunctivae normal.     Pupils: Pupils are equal, round, and reactive to light.  Cardiovascular:     Rate and Rhythm: Normal rate and regular rhythm.     Pulses: Normal pulses.     Heart sounds: Normal heart sounds. No murmur heard.    No gallop.  Pulmonary:     Effort: Pulmonary effort is normal. No respiratory distress.     Breath sounds: Normal breath sounds. No stridor. No wheezing, rhonchi or rales.  Musculoskeletal:        General: Normal range of motion.     Cervical back: Normal range of motion and neck supple.  Skin:    General: Skin is warm and dry.  Neurological:     General: No focal deficit present.     Mental Status: He is alert and oriented to person, place, and time. Mental status is at baseline.     (all labs ordered are listed, but only abnormal results are displayed) Labs Reviewed - No data to display  EKG: None  Radiology: No results found.   Procedures   Medications Ordered  in the ED - No data to display                                  Medical Decision Making Patient is doing well at this time and is stable for discharge home.  Discussed with patient we will provide further pain control as well as treatment for dental infection at this time.  He has no signs of obvious drainable abscess.  He has no signs of acute peritonsillar abscess, Ludwig's angina, retropharyngeal abscess, epiglottitis.  He has stable vital signs with no indication for sepsis.  Patient already has close follow-up with his dentist in 3 days.  Strict turn precautions were provided for any new or worsening symptoms.  Patient voiced understand to the plan and had no additional questions.  Low suspicion for underlying cardiac or pulmonary etiology of his pain.  Risk Prescription drug management.         Final diagnoses:  None    ED Discharge Orders     None          Daralene Lonni JONETTA DEVONNA 07/15/24 1323    Garrick Charleston, MD 07/16/24 1622

## 2024-07-15 NOTE — Discharge Instructions (Addendum)
 Please follow-up closely with your dentist as scheduled.  Take all antibiotics as directed.  You may continue to take Motrin  with the medication you are prescribed but please do not take Tylenol .  Return to emergency department immediately for any new or worsening symptoms.

## 2024-07-15 NOTE — ED Triage Notes (Signed)
 Pt complains of lower gum pain. Swelling noted to lower gums. Pt has dentist appointment Monday however requesting pain medication until he can ben seen by the dentist. Took Ibuprofen  800mg  2 hours ago.
